# Patient Record
Sex: Male | Born: 1935 | Race: White | Hispanic: No | Smoking: Former smoker
Health system: Southern US, Community
[De-identification: ages and names within clinical notes are randomized; demographics above are authoritative.]

## PROBLEM LIST (undated history)

## (undated) DIAGNOSIS — S2249XA Multiple fractures of ribs, unspecified side, initial encounter for closed fracture: Secondary | ICD-10-CM

## (undated) DIAGNOSIS — F0390 Unspecified dementia without behavioral disturbance: Secondary | ICD-10-CM

## (undated) DIAGNOSIS — S2239XA Fracture of one rib, unspecified side, initial encounter for closed fracture: Secondary | ICD-10-CM

## (undated) DIAGNOSIS — S72009A Fracture of unspecified part of neck of unspecified femur, initial encounter for closed fracture: Secondary | ICD-10-CM

---

## 2004-07-22 ENCOUNTER — Inpatient Hospital Stay (HOSPITAL_COMMUNITY): Admission: EM | Admit: 2004-07-22 | Discharge: 2004-07-24 | Payer: Self-pay | Admitting: Emergency Medicine

## 2011-04-30 ENCOUNTER — Emergency Department (HOSPITAL_COMMUNITY)
Admission: EM | Admit: 2011-04-30 | Discharge: 2011-04-30 | Discharge status: Against Medical Advice | Payer: No Typology Code available for payment source | Attending: Emergency Medicine | Admitting: Emergency Medicine

## 2011-04-30 DIAGNOSIS — S40029A Contusion of unspecified upper arm, initial encounter: Secondary | ICD-10-CM | POA: Insufficient documentation

## 2011-04-30 DIAGNOSIS — IMO0002 Reserved for concepts with insufficient information to code with codable children: Secondary | ICD-10-CM | POA: Insufficient documentation

## 2011-04-30 DIAGNOSIS — S43499A Other sprain of unspecified shoulder joint, initial encounter: Secondary | ICD-10-CM | POA: Insufficient documentation

## 2011-04-30 DIAGNOSIS — R29898 Other symptoms and signs involving the musculoskeletal system: Secondary | ICD-10-CM | POA: Insufficient documentation

## 2011-04-30 DIAGNOSIS — M79609 Pain in unspecified limb: Secondary | ICD-10-CM | POA: Insufficient documentation

## 2013-07-27 ENCOUNTER — Inpatient Hospital Stay (HOSPITAL_COMMUNITY): Payer: No Typology Code available for payment source | Admitting: Anesthesiology

## 2013-07-27 ENCOUNTER — Encounter (HOSPITAL_COMMUNITY): Payer: Self-pay | Admitting: Emergency Medicine

## 2013-07-27 ENCOUNTER — Emergency Department (HOSPITAL_COMMUNITY): Payer: No Typology Code available for payment source

## 2013-07-27 ENCOUNTER — Encounter (HOSPITAL_COMMUNITY): Payer: No Typology Code available for payment source | Admitting: Anesthesiology

## 2013-07-27 ENCOUNTER — Encounter (HOSPITAL_COMMUNITY): Admission: EM | Disposition: A | Discharge status: Skilled Nursing Facility | Payer: Self-pay | Source: Home / Self Care

## 2013-07-27 ENCOUNTER — Inpatient Hospital Stay (HOSPITAL_COMMUNITY): Payer: No Typology Code available for payment source

## 2013-07-27 ENCOUNTER — Inpatient Hospital Stay (HOSPITAL_COMMUNITY)
Admission: EM | Admit: 2013-07-27 | Discharge: 2013-08-24 | DRG: 003 | Disposition: A | Discharge status: Skilled Nursing Facility | Payer: No Typology Code available for payment source | Attending: General Surgery | Admitting: General Surgery

## 2013-07-27 DIAGNOSIS — S36113A Laceration of liver, unspecified degree, initial encounter: Principal | ICD-10-CM | POA: Diagnosis present

## 2013-07-27 DIAGNOSIS — D62 Acute posthemorrhagic anemia: Secondary | ICD-10-CM

## 2013-07-27 DIAGNOSIS — S2232XA Fracture of one rib, left side, initial encounter for closed fracture: Secondary | ICD-10-CM

## 2013-07-27 DIAGNOSIS — S2242XA Multiple fractures of ribs, left side, initial encounter for closed fracture: Secondary | ICD-10-CM | POA: Diagnosis present

## 2013-07-27 DIAGNOSIS — S0285XB Fracture of orbit, unspecified, initial encounter for open fracture: Secondary | ICD-10-CM | POA: Diagnosis present

## 2013-07-27 DIAGNOSIS — S060XAA Concussion with loss of consciousness status unknown, initial encounter: Secondary | ICD-10-CM

## 2013-07-27 DIAGNOSIS — S32810A Multiple fractures of pelvis with stable disruption of pelvic ring, initial encounter for closed fracture: Secondary | ICD-10-CM

## 2013-07-27 DIAGNOSIS — S32509A Unspecified fracture of unspecified pubis, initial encounter for closed fracture: Secondary | ICD-10-CM

## 2013-07-27 DIAGNOSIS — S02109A Fracture of base of skull, unspecified side, initial encounter for closed fracture: Secondary | ICD-10-CM | POA: Diagnosis present

## 2013-07-27 DIAGNOSIS — J4489 Other specified chronic obstructive pulmonary disease: Secondary | ICD-10-CM | POA: Diagnosis present

## 2013-07-27 DIAGNOSIS — E876 Hypokalemia: Secondary | ICD-10-CM | POA: Diagnosis present

## 2013-07-27 DIAGNOSIS — F29 Unspecified psychosis not due to a substance or known physiological condition: Secondary | ICD-10-CM | POA: Diagnosis present

## 2013-07-27 DIAGNOSIS — S322XXA Fracture of coccyx, initial encounter for closed fracture: Secondary | ICD-10-CM

## 2013-07-27 DIAGNOSIS — E870 Hyperosmolality and hypernatremia: Secondary | ICD-10-CM | POA: Diagnosis present

## 2013-07-27 DIAGNOSIS — S0100XA Unspecified open wound of scalp, initial encounter: Secondary | ICD-10-CM | POA: Diagnosis present

## 2013-07-27 DIAGNOSIS — S01309A Unspecified open wound of unspecified ear, initial encounter: Secondary | ICD-10-CM | POA: Diagnosis present

## 2013-07-27 DIAGNOSIS — R339 Retention of urine, unspecified: Secondary | ICD-10-CM | POA: Diagnosis present

## 2013-07-27 DIAGNOSIS — S32309A Unspecified fracture of unspecified ilium, initial encounter for closed fracture: Secondary | ICD-10-CM | POA: Diagnosis present

## 2013-07-27 DIAGNOSIS — S139XXA Sprain of joints and ligaments of unspecified parts of neck, initial encounter: Secondary | ICD-10-CM | POA: Diagnosis present

## 2013-07-27 DIAGNOSIS — IMO0002 Reserved for concepts with insufficient information to code with codable children: Secondary | ICD-10-CM | POA: Diagnosis present

## 2013-07-27 DIAGNOSIS — J449 Chronic obstructive pulmonary disease, unspecified: Secondary | ICD-10-CM | POA: Diagnosis present

## 2013-07-27 DIAGNOSIS — S32591A Other specified fracture of right pubis, initial encounter for closed fracture: Secondary | ICD-10-CM | POA: Diagnosis present

## 2013-07-27 DIAGNOSIS — J984 Other disorders of lung: Secondary | ICD-10-CM | POA: Diagnosis present

## 2013-07-27 DIAGNOSIS — S51809A Unspecified open wound of unspecified forearm, initial encounter: Secondary | ICD-10-CM | POA: Diagnosis present

## 2013-07-27 DIAGNOSIS — S31109A Unspecified open wound of abdominal wall, unspecified quadrant without penetration into peritoneal cavity, initial encounter: Secondary | ICD-10-CM | POA: Diagnosis present

## 2013-07-27 DIAGNOSIS — S0190XA Unspecified open wound of unspecified part of head, initial encounter: Secondary | ICD-10-CM | POA: Diagnosis present

## 2013-07-27 DIAGNOSIS — S8000XA Contusion of unspecified knee, initial encounter: Secondary | ICD-10-CM | POA: Diagnosis present

## 2013-07-27 DIAGNOSIS — S0101XA Laceration without foreign body of scalp, initial encounter: Secondary | ICD-10-CM | POA: Diagnosis present

## 2013-07-27 DIAGNOSIS — S0180XA Unspecified open wound of other part of head, initial encounter: Secondary | ICD-10-CM | POA: Diagnosis present

## 2013-07-27 DIAGNOSIS — J15212 Pneumonia due to Methicillin resistant Staphylococcus aureus: Secondary | ICD-10-CM | POA: Diagnosis present

## 2013-07-27 DIAGNOSIS — S2249XA Multiple fractures of ribs, unspecified side, initial encounter for closed fracture: Secondary | ICD-10-CM | POA: Diagnosis present

## 2013-07-27 DIAGNOSIS — T148XXA Other injury of unspecified body region, initial encounter: Secondary | ICD-10-CM | POA: Diagnosis present

## 2013-07-27 DIAGNOSIS — S0280XA Fracture of other specified skull and facial bones, unspecified side, initial encounter for closed fracture: Secondary | ICD-10-CM | POA: Diagnosis present

## 2013-07-27 DIAGNOSIS — S72009A Fracture of unspecified part of neck of unspecified femur, initial encounter for closed fracture: Secondary | ICD-10-CM

## 2013-07-27 DIAGNOSIS — S060X9A Concussion with loss of consciousness of unspecified duration, initial encounter: Secondary | ICD-10-CM | POA: Diagnosis present

## 2013-07-27 DIAGNOSIS — S36115A Moderate laceration of liver, initial encounter: Secondary | ICD-10-CM | POA: Diagnosis present

## 2013-07-27 DIAGNOSIS — E2749 Other adrenocortical insufficiency: Secondary | ICD-10-CM | POA: Diagnosis present

## 2013-07-27 DIAGNOSIS — S51812A Laceration without foreign body of left forearm, initial encounter: Secondary | ICD-10-CM | POA: Diagnosis present

## 2013-07-27 DIAGNOSIS — J189 Pneumonia, unspecified organism: Secondary | ICD-10-CM

## 2013-07-27 DIAGNOSIS — S36899A Unspecified injury of other intra-abdominal organs, initial encounter: Secondary | ICD-10-CM | POA: Diagnosis present

## 2013-07-27 DIAGNOSIS — J40 Bronchitis, not specified as acute or chronic: Secondary | ICD-10-CM | POA: Diagnosis present

## 2013-07-27 DIAGNOSIS — S01312A Laceration without foreign body of left ear, initial encounter: Secondary | ICD-10-CM | POA: Diagnosis present

## 2013-07-27 DIAGNOSIS — D696 Thrombocytopenia, unspecified: Secondary | ICD-10-CM | POA: Diagnosis present

## 2013-07-27 DIAGNOSIS — S72142A Displaced intertrochanteric fracture of left femur, initial encounter for closed fracture: Secondary | ICD-10-CM

## 2013-07-27 DIAGNOSIS — J9600 Acute respiratory failure, unspecified whether with hypoxia or hypercapnia: Secondary | ICD-10-CM | POA: Diagnosis not present

## 2013-07-27 DIAGNOSIS — S2500XA Unspecified injury of thoracic aorta, initial encounter: Secondary | ICD-10-CM | POA: Diagnosis present

## 2013-07-27 DIAGNOSIS — S32592A Other specified fracture of left pubis, initial encounter for closed fracture: Secondary | ICD-10-CM

## 2013-07-27 DIAGNOSIS — S72143A Displaced intertrochanteric fracture of unspecified femur, initial encounter for closed fracture: Secondary | ICD-10-CM | POA: Diagnosis present

## 2013-07-27 DIAGNOSIS — R21 Rash and other nonspecific skin eruption: Secondary | ICD-10-CM | POA: Diagnosis not present

## 2013-07-27 DIAGNOSIS — S32602A Unspecified fracture of left ischium, initial encounter for closed fracture: Secondary | ICD-10-CM | POA: Diagnosis present

## 2013-07-27 DIAGNOSIS — R197 Diarrhea, unspecified: Secondary | ICD-10-CM | POA: Diagnosis not present

## 2013-07-27 DIAGNOSIS — R578 Other shock: Secondary | ICD-10-CM | POA: Diagnosis present

## 2013-07-27 DIAGNOSIS — S37812A Contusion of adrenal gland, initial encounter: Secondary | ICD-10-CM | POA: Diagnosis present

## 2013-07-27 DIAGNOSIS — S3210XA Unspecified fracture of sacrum, initial encounter for closed fracture: Secondary | ICD-10-CM | POA: Diagnosis present

## 2013-07-27 DIAGNOSIS — S32609A Unspecified fracture of unspecified ischium, initial encounter for closed fracture: Secondary | ICD-10-CM | POA: Diagnosis present

## 2013-07-27 HISTORY — PX: I & D EXTREMITY: SHX5045

## 2013-07-27 HISTORY — PX: LACERATION REPAIR: SHX5284

## 2013-07-27 HISTORY — DX: Fracture of one rib, unspecified side, initial encounter for closed fracture: S22.39XA

## 2013-07-27 HISTORY — PX: THORACIC AORTIC ENDOVASCULAR STENT GRAFT: SHX6112

## 2013-07-27 HISTORY — PX: FEMUR IM NAIL: SHX1597

## 2013-07-27 HISTORY — DX: Multiple fractures of ribs, unspecified side, initial encounter for closed fracture: S22.49XA

## 2013-07-27 LAB — POCT I-STAT 7, (LYTES, BLD GAS, ICA,H+H)
ACID-BASE DEFICIT: 5 mmol/L — AB (ref 0.0–2.0)
ACID-BASE DEFICIT: 6 mmol/L — AB (ref 0.0–2.0)
ACID-BASE DEFICIT: 5 mmol/L — AB (ref 0.0–2.0)
Acid-base deficit: 7 mmol/L — ABNORMAL HIGH (ref 0.0–2.0)
BICARBONATE: 20 meq/L (ref 20.0–24.0)
BICARBONATE: 20.1 meq/L (ref 20.0–24.0)
BICARBONATE: 20.4 meq/L (ref 20.0–24.0)
Bicarbonate: 20.7 meq/L (ref 20.0–24.0)
CALCIUM ION: 0.88 mmol/L — AB (ref 1.13–1.30)
CALCIUM ION: 0.91 mmol/L — AB (ref 1.13–1.30)
Calcium, Ion: 0.75 mmol/L — ABNORMAL LOW (ref 1.13–1.30)
Calcium, Ion: 0.65 mmol/L — ABNORMAL LOW (ref 1.13–1.30)
HCT: 19 % — ABNORMAL LOW (ref 39.0–52.0)
HCT: 26 % — ABNORMAL LOW (ref 39.0–52.0)
HEMATOCRIT: 25 % — AB (ref 39.0–52.0)
HEMATOCRIT: 27 % — AB (ref 39.0–52.0)
HEMOGLOBIN: 9.2 g/dL — AB (ref 13.0–17.0)
Hemoglobin: 6.5 g/dL — CL (ref 13.0–17.0)
Hemoglobin: 8.5 g/dL — ABNORMAL LOW (ref 13.0–17.0)
Hemoglobin: 8.8 g/dL — ABNORMAL LOW (ref 13.0–17.0)
O2 SAT: 100 %
O2 Saturation: 100 %
O2 Saturation: 100 %
O2 Saturation: 100 %
PCO2 ART: 36.6 mmHg (ref 35.0–45.0)
PH ART: 7.302 — AB (ref 7.350–7.450)
PH ART: 7.4 (ref 7.350–7.450)
PO2 ART: 478 mmHg — AB (ref 80.0–100.0)
PO2 ART: 479 mmHg — AB (ref 80.0–100.0)
POTASSIUM: 4.6 meq/L (ref 3.7–5.3)
POTASSIUM: 4.2 meq/L (ref 3.7–5.3)
Patient temperature: 36.5
Patient temperature: 34
Patient temperature: 34.5
Potassium: 4.4 mEq/L (ref 3.7–5.3)
Potassium: 4.4 meq/L (ref 3.7–5.3)
SODIUM: 141 meq/L (ref 137–147)
SODIUM: 142 meq/L (ref 137–147)
Sodium: 141 meq/L (ref 137–147)
Sodium: 141 meq/L (ref 137–147)
TCO2: 22 mmol/L (ref 0–100)
TCO2: 21 mmol/L (ref 0–100)
TCO2: 21 mmol/L (ref 0–100)
TCO2: 21 mmol/L (ref 0–100)
pCO2 arterial: 40.1 mmHg (ref 35.0–45.0)
pCO2 arterial: 39.3 mmHg (ref 35.0–45.0)
pCO2 arterial: 32 mmHg — ABNORMAL LOW (ref 35.0–45.0)
pH, Arterial: 7.318 — ABNORMAL LOW (ref 7.350–7.450)
pH, Arterial: 7.34 — ABNORMAL LOW (ref 7.350–7.450)
pO2, Arterial: 461 mmHg — ABNORMAL HIGH (ref 80.0–100.0)
pO2, Arterial: 460 mmHg — ABNORMAL HIGH (ref 80.0–100.0)

## 2013-07-27 LAB — BLOOD GAS, ARTERIAL
Acid-base deficit: 7.1 mmol/L — ABNORMAL HIGH (ref 0.0–2.0)
Bicarbonate: 16.5 mEq/L — ABNORMAL LOW (ref 20.0–24.0)
Drawn by: 36274
FIO2: 1 %
O2 Saturation: 100 %
PEEP: 5 cmH2O
Patient temperature: 93
RATE: 24 resp/min
TCO2: 17.3 mmol/L (ref 0–100)
VT: 500 mL
pCO2 arterial: 22.2 mmHg — ABNORMAL LOW (ref 35.0–45.0)
pH, Arterial: 7.467 — ABNORMAL HIGH (ref 7.350–7.450)
pO2, Arterial: 277 mmHg — ABNORMAL HIGH (ref 80.0–100.0)

## 2013-07-27 LAB — COMPREHENSIVE METABOLIC PANEL
ALT: 113 U/L — AB (ref 0–53)
AST: 152 U/L — AB (ref 0–37)
Albumin: 2.9 g/dL — ABNORMAL LOW (ref 3.5–5.2)
Alkaline Phosphatase: 45 U/L (ref 39–117)
BUN: 17 mg/dL (ref 6–23)
CALCIUM: 7.7 mg/dL — AB (ref 8.4–10.5)
CO2: 25 mEq/L (ref 19–32)
Chloride: 100 mEq/L (ref 96–112)
Creatinine, Ser: 1.05 mg/dL (ref 0.50–1.35)
GFR calc non Af Amer: 65 mL/min — ABNORMAL LOW (ref >90)
GFR, EST AFRICAN AMERICAN: 75 mL/min — AB (ref >90)
Glucose, Bld: 148 mg/dL — ABNORMAL HIGH (ref 70–99)
Potassium: 4.1 mEq/L (ref 3.7–5.3)
SODIUM: 139 meq/L (ref 137–147)
TOTAL PROTEIN: 6 g/dL (ref 6.0–8.3)
Total Bilirubin: <0.2 mg/dL — ABNORMAL LOW (ref 0.3–1.2)

## 2013-07-27 LAB — POCT I-STAT, CHEM 8
BUN: 17 mg/dL (ref 6–23)
CHLORIDE: 101 meq/L (ref 96–112)
Calcium, Ion: 1.1 mmol/L — ABNORMAL LOW (ref 1.13–1.30)
Creatinine, Ser: 1.2 mg/dL (ref 0.50–1.35)
GLUCOSE: 145 mg/dL — AB (ref 70–99)
HEMATOCRIT: 34 % — AB (ref 39.0–52.0)
Hemoglobin: 11.6 g/dL — ABNORMAL LOW (ref 13.0–17.0)
POTASSIUM: 3.9 meq/L (ref 3.7–5.3)
SODIUM: 140 meq/L (ref 137–147)
TCO2: 26 mmol/L (ref 0–100)

## 2013-07-27 LAB — URINALYSIS, ROUTINE W REFLEX MICROSCOPIC
BILIRUBIN URINE: NEGATIVE
Glucose, UA: NEGATIVE mg/dL
Ketones, ur: NEGATIVE mg/dL
Leukocytes, UA: NEGATIVE
NITRITE: NEGATIVE
PH: 6 (ref 5.0–8.0)
Protein, ur: NEGATIVE mg/dL
SPECIFIC GRAVITY, URINE: 1.037 — AB (ref 1.005–1.030)
Urobilinogen, UA: 0.2 mg/dL (ref 0.0–1.0)

## 2013-07-27 LAB — CBC
HCT: 29.1 % — ABNORMAL LOW (ref 39.0–52.0)
HEMATOCRIT: 33.7 % — AB (ref 39.0–52.0)
HEMOGLOBIN: 11.1 g/dL — AB (ref 13.0–17.0)
HEMOGLOBIN: 10 g/dL — AB (ref 13.0–17.0)
MCH: 32 pg (ref 26.0–34.0)
MCH: 29.4 pg (ref 26.0–34.0)
MCHC: 32.9 g/dL (ref 30.0–36.0)
MCHC: 34.4 g/dL (ref 30.0–36.0)
MCV: 97.1 fL (ref 78.0–100.0)
MCV: 85.6 fL (ref 78.0–100.0)
Platelets: 301 10*3/uL (ref 150–400)
Platelets: 77 10*3/uL — ABNORMAL LOW (ref 150–400)
RBC: 3.47 MIL/uL — ABNORMAL LOW (ref 4.22–5.81)
RBC: 3.4 MIL/uL — AB (ref 4.22–5.81)
RDW: 12.9 % (ref 11.5–15.5)
RDW: 14.3 % (ref 11.5–15.5)
WBC: 13.1 10*3/uL — ABNORMAL HIGH (ref 4.0–10.5)
WBC: 7.4 10*3/uL (ref 4.0–10.5)

## 2013-07-27 LAB — URINE MICROSCOPIC-ADD ON

## 2013-07-27 LAB — DIC (DISSEMINATED INTRAVASCULAR COAGULATION) PANEL
APTT: 50 s — AB (ref 24–37)
D-Dimer, Quant: 18.11 ug/mL-FEU — ABNORMAL HIGH (ref 0.00–0.48)
FIBRINOGEN: 107 mg/dL — AB (ref 204–475)
PLATELETS: 58 10*3/uL — AB (ref 150–400)
SMEAR REVIEW: NONE SEEN

## 2013-07-27 LAB — CG4 I-STAT (LACTIC ACID): LACTIC ACID, VENOUS: 3.41 mmol/L — AB (ref 0.5–2.2)

## 2013-07-27 LAB — PROTIME-INR
INR: 1.17 (ref 0.00–1.49)
INR: 1.43 (ref 0.00–1.49)
Prothrombin Time: 14.7 seconds (ref 11.6–15.2)
Prothrombin Time: 17.1 seconds — ABNORMAL HIGH (ref 11.6–15.2)

## 2013-07-27 LAB — DIC (DISSEMINATED INTRAVASCULAR COAGULATION)PANEL
INR: 1.96 — ABNORMAL HIGH (ref 0.00–1.49)
Prothrombin Time: 21.7 seconds — ABNORMAL HIGH (ref 11.6–15.2)

## 2013-07-27 LAB — ABO/RH: ABO/RH(D): B POS

## 2013-07-27 LAB — PREPARE RBC (CROSSMATCH)

## 2013-07-27 SURGERY — REPAIR, LACERATION, 2 OR MORE
Anesthesia: General | Site: Leg Upper

## 2013-07-27 MED ORDER — ROCURONIUM BROMIDE 100 MG/10ML IV SOLN
INTRAVENOUS | Status: DC | PRN
  Administered 2013-07-27: 50 mg via INTRAVENOUS
  Administered 2013-07-27: 30 mg via INTRAVENOUS
  Administered 2013-07-27: 20 mg via INTRAVENOUS

## 2013-07-27 MED ORDER — SODIUM CHLORIDE 0.9 % IR SOLN
Status: DC | PRN
  Administered 2013-07-27: 20:00:00

## 2013-07-27 MED ORDER — MORPHINE SULFATE 2 MG/ML IJ SOLN: 2.0000 mg | INTRAMUSCULAR | Status: DC | PRN

## 2013-07-27 MED ORDER — PANTOPRAZOLE SODIUM 40 MG PO TBEC: 40.0000 mg | DELAYED_RELEASE_TABLET | Freq: Every day | ORAL | Status: DC

## 2013-07-27 MED ORDER — METOPROLOL TARTRATE 1 MG/ML IV SOLN: 5.0000 mg | Freq: Four times a day (QID) | INTRAVENOUS | Status: DC | PRN

## 2013-07-27 MED ORDER — SODIUM CHLORIDE 0.9 % IV SOLN
1000.0000 mL | Freq: Once | INTRAVENOUS | Status: AC
  Administered 2013-07-27: 1000 mL via INTRAVENOUS

## 2013-07-27 MED ORDER — FENTANYL CITRATE 0.05 MG/ML IJ SOLN
50.0000 ug | Freq: Once | INTRAMUSCULAR | Status: AC
  Administered 2013-07-27: 50 ug via INTRAVENOUS

## 2013-07-27 MED ORDER — IOHEXOL 300 MG/ML  SOLN
100.0000 mL | Freq: Once | INTRAMUSCULAR | Status: AC | PRN
  Administered 2013-07-27: 100 mL via INTRAVENOUS

## 2013-07-27 MED ORDER — KCL IN DEXTROSE-NACL 20-5-0.9 MEQ/L-%-% IV SOLN
INTRAVENOUS | Status: DC
  Administered 2013-07-28 – 2013-08-03 (×8): via INTRAVENOUS
  Administered 2013-08-04: 50 mL/h via INTRAVENOUS
  Filled 2013-07-27 (×13): qty 1000

## 2013-07-27 MED ORDER — ETOMIDATE 2 MG/ML IV SOLN
INTRAVENOUS | Status: DC | PRN
  Administered 2013-07-27: 16 mg via INTRAVENOUS

## 2013-07-27 MED ORDER — SODIUM CHLORIDE 0.9 % IV SOLN
INTRAVENOUS | Status: DC | PRN
  Administered 2013-07-27: 18:00:00 via INTRAVENOUS

## 2013-07-27 MED ORDER — FENTANYL CITRATE 0.05 MG/ML IJ SOLN
50.0000 ug | Freq: Once | INTRAMUSCULAR | Status: AC
Start: 2013-07-27 — End: 2013-07-27
  Administered 2013-07-27: 50 ug via INTRAVENOUS

## 2013-07-27 MED ORDER — 0.9 % SODIUM CHLORIDE (POUR BTL) OPTIME
TOPICAL | Status: DC | PRN
  Administered 2013-07-27: 2000 mL

## 2013-07-27 MED ORDER — PROPOFOL INFUSION 10 MG/ML OPTIME
INTRAVENOUS | Status: DC | PRN
  Administered 2013-07-27: 50 ug/kg/min via INTRAVENOUS

## 2013-07-27 MED ORDER — PROPOFOL 10 MG/ML IV EMUL
5.0000 ug/kg/min | INTRAVENOUS | Status: DC
  Administered 2013-07-27: 70 ug/kg/min via INTRAVENOUS
  Administered 2013-07-28: 30 ug/kg/min via INTRAVENOUS
  Administered 2013-07-28: 50 ug/kg/min via INTRAVENOUS
  Administered 2013-07-28: 30 ug/kg/min via INTRAVENOUS
  Administered 2013-07-28: 70 ug/kg/min via INTRAVENOUS
  Administered 2013-07-29 – 2013-07-31 (×7): 30 ug/kg/min via INTRAVENOUS
  Administered 2013-08-01: 15 ug/kg/min via INTRAVENOUS
  Filled 2013-07-27 (×15): qty 100

## 2013-07-27 MED ORDER — FENTANYL CITRATE 0.05 MG/ML IJ SOLN
50.0000 ug | INTRAMUSCULAR | Status: DC | PRN
  Administered 2013-07-28: 10:00:00 via INTRAVENOUS
  Administered 2013-07-29: 50 ug via INTRAVENOUS
  Filled 2013-07-27 (×3): qty 2

## 2013-07-27 MED ORDER — ONDANSETRON HCL 4 MG PO TABS: 4.0000 mg | ORAL_TABLET | Freq: Four times a day (QID) | ORAL | Status: DC | PRN

## 2013-07-27 MED ORDER — PHENYLEPHRINE HCL 10 MG/ML IJ SOLN
10.0000 mg | INTRAVENOUS | Status: DC | PRN
  Administered 2013-07-27: 50 ug/min via INTRAVENOUS

## 2013-07-27 MED ORDER — CEFAZOLIN SODIUM-DEXTROSE 2-3 GM-% IV SOLR
INTRAVENOUS | Status: AC
  Filled 2013-07-27: qty 50

## 2013-07-27 MED ORDER — PROTAMINE SULFATE 10 MG/ML IV SOLN
INTRAVENOUS | Status: DC | PRN
  Administered 2013-07-27: 40 mg via INTRAVENOUS
  Administered 2013-07-27: 10 mg via INTRAVENOUS

## 2013-07-27 MED ORDER — HEPARIN SODIUM (PORCINE) 1000 UNIT/ML IJ SOLN
INTRAMUSCULAR | Status: DC | PRN
  Administered 2013-07-27: 4000 [IU] via INTRAVENOUS

## 2013-07-27 MED ORDER — ONDANSETRON HCL 4 MG/2ML IJ SOLN
4.0000 mg | Freq: Four times a day (QID) | INTRAMUSCULAR | Status: DC | PRN
Start: 2013-07-27 — End: 2013-08-24
  Administered 2013-08-24: 4 mg via INTRAVENOUS
  Filled 2013-07-27: qty 2

## 2013-07-27 MED ORDER — BACITRACIN-NEOMYCIN-POLYMYXIN 400-5-5000 EX OINT
TOPICAL_OINTMENT | CUTANEOUS | Status: AC
  Filled 2013-07-27: qty 1

## 2013-07-27 MED ORDER — FENTANYL CITRATE 0.05 MG/ML IJ SOLN
INTRAMUSCULAR | Status: DC | PRN
  Administered 2013-07-27 (×2): 100 ug via INTRAVENOUS
  Administered 2013-07-27: 150 ug via INTRAVENOUS

## 2013-07-27 MED ORDER — ONDANSETRON HCL 4 MG/2ML IJ SOLN
INTRAMUSCULAR | Status: AC
  Filled 2013-07-27: qty 2

## 2013-07-27 MED ORDER — LIDOCAINE HCL (CARDIAC) 20 MG/ML IV SOLN
INTRAVENOUS | Status: DC | PRN
  Administered 2013-07-27: 25 mg via INTRAVENOUS

## 2013-07-27 MED ORDER — LACTATED RINGERS IV SOLN
INTRAVENOUS | Status: DC | PRN
  Administered 2013-07-27 (×2): via INTRAVENOUS

## 2013-07-27 MED ORDER — SODIUM CHLORIDE 0.9 % IV SOLN
1000.0000 mL | INTRAVENOUS | Status: DC
  Administered 2013-07-27: 1000 mL via INTRAVENOUS

## 2013-07-27 MED ORDER — PANTOPRAZOLE SODIUM 40 MG IV SOLR
40.0000 mg | Freq: Every day | INTRAVENOUS | Status: DC
  Administered 2013-07-28 – 2013-08-04 (×8): 40 mg via INTRAVENOUS
  Filled 2013-07-27 (×9): qty 40

## 2013-07-27 MED ORDER — SUCCINYLCHOLINE CHLORIDE 20 MG/ML IJ SOLN
INTRAMUSCULAR | Status: DC | PRN
  Administered 2013-07-27: 100 mg via INTRAVENOUS

## 2013-07-27 MED ORDER — FENTANYL CITRATE 0.05 MG/ML IJ SOLN
INTRAMUSCULAR | Status: AC
  Filled 2013-07-27: qty 2

## 2013-07-27 MED ORDER — IODIXANOL 320 MG/ML IV SOLN
INTRAVENOUS | Status: DC | PRN
  Administered 2013-07-27: 22.1 mL via INTRA_ARTERIAL

## 2013-07-27 MED ORDER — CALCIUM CHLORIDE 10 % IV SOLN
INTRAVENOUS | Status: DC | PRN
  Administered 2013-07-27: 500 mg via INTRAVENOUS
  Administered 2013-07-27: 50 mg via INTRAVENOUS

## 2013-07-27 MED ORDER — TETANUS-DIPHTH-ACELL PERTUSSIS 5-2.5-18.5 LF-MCG/0.5 IM SUSP
0.5000 mL | Freq: Once | INTRAMUSCULAR | Status: AC
  Administered 2013-07-27: 0.5 mL via INTRAMUSCULAR
  Filled 2013-07-27: qty 0.5

## 2013-07-27 MED ORDER — DEXTROSE 5 % IV SOLN
2.0000 g | Freq: Once | INTRAVENOUS | Status: AC
  Administered 2013-07-27 (×2): 2 g via INTRAVENOUS
  Filled 2013-07-27: qty 20

## 2013-07-27 MED ORDER — LACTATED RINGERS IV SOLN
INTRAVENOUS | Status: DC | PRN
  Administered 2013-07-27: 18:00:00 via INTRAVENOUS

## 2013-07-27 MED ORDER — ONDANSETRON HCL 4 MG/2ML IJ SOLN
4.0000 mg | Freq: Once | INTRAMUSCULAR | Status: AC
  Administered 2013-07-27: 4 mg via INTRAVENOUS

## 2013-07-27 SURGICAL SUPPLY — 110 items
APL SKNCLS STERI-STRIP NONHPOA (GAUZE/BANDAGES/DRESSINGS) ×7
ATTRACTOMAT 16X20 MAGNETIC DRP (DRAPES) IMPLANT
BAG BANDED W/RUBBER/TAPE 36X54 (MISCELLANEOUS) ×9 IMPLANT
BAG DECANTER FOR FLEXI CONT (MISCELLANEOUS) ×9 IMPLANT
BAG EQP BAND 135X91 W/RBR TAPE (MISCELLANEOUS) ×7
BAG SNAP BAND KOVER 36X36 (MISCELLANEOUS) ×9 IMPLANT
BANDAGE ELASTIC 4 VELCRO ST LF (GAUZE/BANDAGES/DRESSINGS) ×9 IMPLANT
BANDAGE GAUZE ELAST BULKY 4 IN (GAUZE/BANDAGES/DRESSINGS) ×9 IMPLANT
BENZOIN TINCTURE PRP APPL 2/3 (GAUZE/BANDAGES/DRESSINGS) ×9 IMPLANT
BLADE SURG 15 STRL LF DISP TIS (BLADE) ×7 IMPLANT
BLADE SURG 15 STRL SS (BLADE) ×9
BNDG COHESIVE 4X5 TAN STRL (GAUZE/BANDAGES/DRESSINGS) ×9 IMPLANT
CANISTER SUCTION 2500CC (MISCELLANEOUS) ×18 IMPLANT
CATH ACCU-VU SIZ PIG 5F 100CM (CATHETERS) ×5 IMPLANT
CATH BEACON 5 .035 65 C1 TIP (CATHETERS) ×5 IMPLANT
CATH OMNI FLUSH .035X70CM (CATHETERS) ×5 IMPLANT
CATH SOS OMNI O 5F 80CM (CATHETERS) ×5 IMPLANT
CLEANER TIP ELECTROSURG 2X2 (MISCELLANEOUS) ×9 IMPLANT
CLIP LIGATING EXTRA MED SLVR (CLIP) ×5 IMPLANT
CLIP LIGATING EXTRA SM BLUE (MISCELLANEOUS) ×5 IMPLANT
CLOSURE WOUND 1/2 X4 (GAUZE/BANDAGES/DRESSINGS) ×1
CLOTH BEACON ORANGE TIMEOUT ST (SAFETY) ×18 IMPLANT
CONT SPEC 4OZ CLIKSEAL STRL BL (MISCELLANEOUS) ×9 IMPLANT
COVER DOME SNAP 22 D (MISCELLANEOUS) ×9 IMPLANT
COVER MAYO STAND STRL (DRAPES) ×9 IMPLANT
COVER PROBE W GEL 5X96 (DRAPES) ×9 IMPLANT
COVER SURGICAL LIGHT HANDLE (MISCELLANEOUS) ×27 IMPLANT
CRADLE DONUT ADULT HEAD (MISCELLANEOUS) IMPLANT
CUFF TOURNIQUET SINGLE 18IN (TOURNIQUET CUFF) ×9 IMPLANT
DRAIN CHANNEL 15F RND FF W/TCR (WOUND CARE) IMPLANT
DRAPE INCISE 13X13 STRL (DRAPES) IMPLANT
DRAPE TABLE COVER HEAVY DUTY (DRAPES) ×9 IMPLANT
DRESSING OPSITE X SMALL 2X3 (GAUZE/BANDAGES/DRESSINGS) ×36 IMPLANT
DRSG EMULSION OIL 3X3 NADH (GAUZE/BANDAGES/DRESSINGS) ×9 IMPLANT
DRSG PAD ABDOMINAL 8X10 ST (GAUZE/BANDAGES/DRESSINGS) ×18 IMPLANT
DRSG TEGADERM 2-3/8X2-3/4 SM (GAUZE/BANDAGES/DRESSINGS) ×5 IMPLANT
ELECT CAUTERY BLADE 6.4 (BLADE) ×5 IMPLANT
ELECT COATED BLADE 2.86 ST (ELECTRODE) ×9 IMPLANT
ELECT REM PT RETURN 9FT ADLT (ELECTROSURGICAL) ×27
ELECTRODE REM PT RTRN 9FT ADLT (ELECTROSURGICAL) ×21 IMPLANT
ENDOPROSTHESIS THORAC 34X34X10 (Endovascular Graft) ×3 IMPLANT
ENDOPROTH THORACIC 34X34X10 (Endovascular Graft) ×9 IMPLANT
EVACUATOR SILICONE 100CC (DRAIN) IMPLANT
GAUZE SPONGE 2X2 8PLY STRL LF (GAUZE/BANDAGES/DRESSINGS) ×7 IMPLANT
GAUZE SPONGE 4X4 16PLY XRAY LF (GAUZE/BANDAGES/DRESSINGS) ×9 IMPLANT
GAUZE XEROFORM 5X9 LF (GAUZE/BANDAGES/DRESSINGS) ×24 IMPLANT
GLOVE BIOGEL PI IND STRL 7.5 (GLOVE) ×3 IMPLANT
GLOVE BIOGEL PI IND STRL 8 (GLOVE) ×7 IMPLANT
GLOVE BIOGEL PI INDICATOR 7.5 (GLOVE) ×2
GLOVE BIOGEL PI INDICATOR 8 (GLOVE) ×2
GLOVE ECLIPSE 7.5 STRL STRAW (GLOVE) ×9 IMPLANT
GLOVE ORTHO TXT STRL SZ7.5 (GLOVE) ×9 IMPLANT
GLOVE SS BIOGEL STRL SZ 7.5 (GLOVE) ×7 IMPLANT
GLOVE SUPERSENSE BIOGEL SZ 7.5 (GLOVE) ×2
GOWN PREVENTION PLUS LG XLONG (DISPOSABLE) IMPLANT
GOWN PREVENTION PLUS XLARGE (GOWN DISPOSABLE) ×9 IMPLANT
GOWN STRL NON-REIN LRG LVL3 (GOWN DISPOSABLE) ×36 IMPLANT
GOWN STRL REUS W/ TWL LRG LVL3 (GOWN DISPOSABLE) ×21 IMPLANT
GOWN STRL REUS W/TWL LRG LVL3 (GOWN DISPOSABLE) ×27
GRAFT BALLN CATH 65CM (STENTS) ×3 IMPLANT
KIT BASIN OR (CUSTOM PROCEDURE TRAY) ×27 IMPLANT
KIT ROOM TURNOVER OR (KITS) ×27 IMPLANT
MANIFOLD NEPTUNE II (INSTRUMENTS) ×9 IMPLANT
NDL PERC 18GX7CM (NEEDLE) ×4 IMPLANT
NEEDLE PERC 18GX7CM (NEEDLE) ×9 IMPLANT
NS IRRIG 1000ML POUR BTL (IV SOLUTION) ×27 IMPLANT
PACK AORTA (CUSTOM PROCEDURE TRAY) ×9 IMPLANT
PACK ORTHO EXTREMITY (CUSTOM PROCEDURE TRAY) ×9 IMPLANT
PAD ARMBOARD 7.5X6 YLW CONV (MISCELLANEOUS) ×54 IMPLANT
PENCIL BUTTON HOLSTER BLD 10FT (ELECTRODE) IMPLANT
PENCIL FOOT CONTROL (ELECTRODE) ×9 IMPLANT
PROTECTION STATION PRESSURIZED (MISCELLANEOUS) ×9
SHEATH AVANTI 11CM 8FR (MISCELLANEOUS) ×5 IMPLANT
SHEATH BRITE TIP 8FR 23CM (MISCELLANEOUS) ×5 IMPLANT
SHEATH DRYSEAL 22FRX28CM (MISCELLANEOUS) ×5 IMPLANT
SPONGE GAUZE 2X2 STER 10/PKG (GAUZE/BANDAGES/DRESSINGS) ×2
SPONGE GAUZE 4X4 12PLY (GAUZE/BANDAGES/DRESSINGS) ×9 IMPLANT
SPONGE INTESTINAL PEANUT (DISPOSABLE) IMPLANT
SPONGE LAP 18X18 X RAY DECT (DISPOSABLE) ×9 IMPLANT
SPONGE LAP 4X18 X RAY DECT (DISPOSABLE) ×9 IMPLANT
STAPLER VISISTAT 35W (STAPLE) IMPLANT
STATION PROTECTION PRESSURIZED (MISCELLANEOUS) ×7 IMPLANT
STENT GRAFT BALLN CATH 65CM (STENTS) ×2
STOCKINETTE IMPERVIOUS 9X36 MD (GAUZE/BANDAGES/DRESSINGS) ×9 IMPLANT
STOPCOCK MORSE 400PSI 3WAY (MISCELLANEOUS) ×9 IMPLANT
STRIP CLOSURE SKIN 1/2X4 (GAUZE/BANDAGES/DRESSINGS) ×8 IMPLANT
SUT CHROMIC 4 0 P 3 18 (SUTURE) ×5 IMPLANT
SUT ETHILON 4 0 PS 2 18 (SUTURE) ×41 IMPLANT
SUT ETHILON 5 0 P 3 18 (SUTURE) ×2
SUT NYLON ETHILON 5-0 P-3 1X18 (SUTURE) ×3 IMPLANT
SUT VIC AB 3-0 SH 18 (SUTURE) IMPLANT
SUT VICRYL 4-0 PS2 18IN ABS (SUTURE) ×18 IMPLANT
SYR 20CC LL (SYRINGE) ×18 IMPLANT
SYR 30ML LL (SYRINGE) ×5 IMPLANT
SYR 5ML LL (SYRINGE) ×4 IMPLANT
SYR MEDRAD MARK V 150ML (SYRINGE) ×9 IMPLANT
SYRINGE 10CC LL (SYRINGE) ×32 IMPLANT
TOWEL OR 17X24 6PK STRL BLUE (TOWEL DISPOSABLE) ×36 IMPLANT
TOWEL OR 17X26 10 PK STRL BLUE (TOWEL DISPOSABLE) ×36 IMPLANT
TRAY ENT MC OR (CUSTOM PROCEDURE TRAY) ×9 IMPLANT
TRAY FOLEY CATH 16FRSI W/METER (SET/KITS/TRAYS/PACK) ×9 IMPLANT
TUBE ANAEROBIC SPECIMEN COL (MISCELLANEOUS) IMPLANT
TUBE CONNECTING 12'X1/4 (SUCTIONS) ×1
TUBE CONNECTING 12X1/4 (SUCTIONS) ×8 IMPLANT
TUBING HIGH PRESSURE 120CM (CONNECTOR) ×9 IMPLANT
UNDERPAD 30X30 INCONTINENT (UNDERPADS AND DIAPERS) ×9 IMPLANT
WIRE AMPLATZ SS-J .035X180CM (WIRE) ×10 IMPLANT
WIRE BENTSON .035X145CM (WIRE) ×10 IMPLANT
WIRE STIFF LUNDERQUIST 260CM (WIRE) ×5 IMPLANT
YANKAUER SUCT BULB TIP NO VENT (SUCTIONS) ×9 IMPLANT

## 2013-07-27 NOTE — ED Notes (Signed)
Lab results reported to EDP. 

## 2013-07-27 NOTE — H&P (Signed)
Critical care 65 minutes  Also has significant abrasions and lacs L forearm - including elbow area likely into joint. Possible L knee injury as well. Dr. Ophelia CharterYates present at bedside.  Dr. Arbie CookeyEarly also present at bedside. BP dropped to high 70's. Transfused 2u PRBC. Will not push BP too high with aortic injury.   Patient examined and I agree with the assessment and plan  Violeta GelinasBurke Dayle Mcnerney, MD, MPH, FACS Pager: [phone]  07/27/2013 5:13 PM

## 2013-07-27 NOTE — ED Notes (Signed)
Pt arrival to CT with this RN
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
## 2013-07-27 NOTE — Anesthesia Preprocedure Evaluation (Addendum)
Anesthesia Evaluation  Patient identified by MRN, date of birth, ID band Patient awake    Reviewed: Allergy & Precautions, H&P , NPO status , Patient's Chart, lab work & pertinent test results  History of Anesthesia Complications Negative for: history of anesthetic complications  Airway Mallampati: II TM Distance: >3 FB Neck ROM: Limited    Dental  (+) Upper Dentures, Poor Dentition, Loose, Missing and Dental Advisory Given   Pulmonary COPDCurrent Smoker,  breath sounds clear to auscultation        Cardiovascular Rhythm:Regular Rate:Tachycardia  Acute thoracic aorta tear:  Hypotensive, receiving PRBCs   Neuro/Psych confused    GI/Hepatic negative GI ROS, Liver lac: elevated LFTs   Endo/Other  negative endocrine ROS  Renal/GU negative Renal ROS     Musculoskeletal   Abdominal   Peds  Hematology Received 2u PRBC and 1u FFP in ED   Anesthesia Other Findings   Reproductive/Obstetrics                          Anesthesia Physical Anesthesia Plan  ASA: IV and emergent  Anesthesia Plan: General   Post-op Pain Management:    Induction: Intravenous  Airway Management Planned: Oral ETT  Additional Equipment: Arterial line, CVP and Ultrasound Guidance Line Placement  Intra-op Plan:   Post-operative Plan: Post-operative intubation/ventilation  Informed Consent:   Only emergency history available  Plan Discussed with: CRNA and Surgeon  Anesthesia Plan Comments: (Plan routine monitors, A line, central access, GETA with VideoGlide intubation, transfusion, and post op ventilation)       Anesthesia Quick Evaluation

## 2013-07-27 NOTE — Brief Op Note (Signed)
07/27/2013  9:24 PM  PATIENT:  [name] [name]  80 y.o. male  PRE-OPERATIVE DIAGNOSIS:  Aorta  POST-OPERATIVE DIAGNOSIS:  Tear Descending Aorta,   Open left elbow laceration, multiple left forearm laceration. Left proximal femur fracture  PROCEDURE:  Procedure(s): THORACIC AORTIC ENDOVASCULAR STENT GRAFT (N/A) IRRIGATION AND DEBRIDEMENT ELBOW (Left) Closure of Right Eye Lacerationl; Closure of Left Post-Auricular Laceration Femoral Pin Traction (Left)  Irrigation and excisional debridement of left open elbow laceration, debridement and closure of multiple forearm lacerations.    SURGEON:  Surgeon(s) and Role: Panel 1:    * Suzanna ObeyJohn Byers, MD - Primary  Panel 2:    * Larina Earthlyodd F Early, MD - Primary  Panel 3:    * Eldred MangesMark C Daziya Redmond, MD - Primary  PHYSICIAN ASSISTANT:   ASSISTANTS: none   ANESTHESIA:   general  EBL:  Total I/O In: 3618 [I.V.:500; Blood:3118] Out: 500 [Urine:500]  BLOOD ADMINISTERED:10 units CC PRBC  DRAINS: none   LOCAL MEDICATIONS USED:  NONE  SPECIMEN:  No Specimen  DISPOSITION OF SPECIMEN:  N/A  COUNTS:  YES  TOURNIQUET:  * No tourniquets in log *  DICTATION: .Other Dictation: Dictation Number [id number]  PLAN OF CARE: to ICU  PATIENT DISPOSITION:  ICU - intubated and critically ill.   Delay start of Pharmacological VTE agent (>24hrs) due to surgical blood loss or risk of bleeding: yes.  INR 2.  NO LOVENOX at this time.
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
## 2013-07-27 NOTE — Progress Notes (Signed)
Orthopedic Tech Progress Note Patient Details:  [name] [name] 12/12/1930 [id number]  Musculoskeletal Traction Type of Traction: Skeletal (Balanced Suspension) Traction Location: lle Traction Weight: 25 lbs  20lbs to femur; 5lbs to foot as ordered by Dr. Lyman SpellerYates  [name] [name] 07/27/2013, 11:08 PM
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
## 2013-07-27 NOTE — Anesthesia Procedure Notes (Addendum)
Anesthesia Procedure Note CVP: Timeout, sterile prep, drape, FBP R neck.  Trendelenburg position.  1% lido local trocar RIJ 1st pass with US guidance.  Cordis introducer placed over J wire. Biopatch and sterile dressing on.  Patient tolerated well.  VSS.  Jenita Seashore, MD  18:25-18:36 Procedure Name: Intubation Date/Time: 07/27/2013 6:23 PM Performed by: Trixie Deis A Pre-anesthesia Checklist: Patient identified, Timeout performed, Emergency Drugs available, Suction available and Patient being monitored Patient Re-evaluated:Patient Re-evaluated prior to inductionOxygen Delivery Method: Circle system utilized Preoxygenation: Pre-oxygenation with 100% oxygen Intubation Type: IV induction and Rapid sequence Grade View: Grade I Tube type: Subglottic suction tube Tube size: 7.5 mm Number of attempts: 1 Airway Equipment and Method: Rigid stylet and Video-laryngoscopy Placement Confirmation: ETT inserted through vocal cords under direct vision,  breath sounds checked- equal and bilateral and positive ETCO2 Secured at: 23 cm Tube secured with: Tape Dental Injury: Teeth and Oropharynx as per pre-operative assessment    Anesthesia Procedure Note CVP: Timeout, sterile prep, drape, FBP R neck.  Trendelenburg position.  1% lido local trocar RIJ 1st pass with US guidance.  Cordis introducer placed over J wire. Biopatch and sterile dressing on.  Patient tolerated well.  VSS.  Jenita Seashore, MD  [phone]

## 2013-07-27 NOTE — Preoperative (Signed)
Beta Blockers   Reason not to administer Beta Blockers:Not Applicable 

## 2013-07-27 NOTE — Progress Notes (Addendum)
Orthopedics  Due to  Patient 10 units PRBC's with INR 2.0 platelets dropped to 58K we will delay fixation of femur . Femoral traction pin applied for skeletal traction.  Elbow open injury washed out and closed. Will need ICU stabilization and delay for a few days femur rod placement if patient improves.  With INR of 2.0 his femur fracture surgery  at this time risks worsening DIC progression.  Will follow.   Ophelia CharterYates [phone]

## 2013-07-27 NOTE — ED Notes (Signed)
[employer] in to speak with pt
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
## 2013-07-27 NOTE — ED Notes (Signed)
Pt vomited all over himself.

## 2013-07-27 NOTE — OR Nursing (Signed)
Dr. Jearld FentonByers Procedure 1- Closure of Right Eye Laceration and Left Post-Auricular Laceration ended at 1911.

## 2013-07-27 NOTE — Transfer of Care (Signed)
Immediate Anesthesia Transfer of Care Note  Patient: [name] [name]  Procedure(s) Performed: Procedure(s): THORACIC AORTIC ENDOVASCULAR STENT GRAFT (N/A) IRRIGATION AND DEBRIDEMENT ELBOW (Left) Closure of Right Eye Lacerationl; Closure of Left Post-Auricular Laceration Femoral Pin Traction (Left)  Patient Location: NICU  Anesthesia Type:General  Level of Consciousness: Patient remains intubated per anesthesia plan  Airway & Oxygen Therapy: Patient remains intubated per anesthesia plan and Patient placed on Ventilator (see vital sign flow sheet for setting)  Post-op Assessment: Report given to PACU RN and Post -op Vital signs reviewed and stable  Post vital signs: Reviewed and stable  Complications: No apparent anesthesia complications
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
## 2013-07-27 NOTE — ED Notes (Signed)
Pharmacy contacted about antibiotic
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
## 2013-07-27 NOTE — Progress Notes (Signed)
Orthopedic Tech Progress Note Patient Details:  Dorena DewJerry Crevier 01/20/1931 [id number] Hare traction applied per verbal orders in Trauma Rm B to stabilize suspected hip dislocation and femur fracture. Patient to be transported to CT and xray and further evaluated. Ortho Tech on stand bye for further instructions.  Musculoskeletal Traction Type of Traction: Hare Traction Traction Location: LLE    Asia R Janee Mornhompson 07/27/2013, 3:29 PM
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
## 2013-07-28 DIAGNOSIS — J95821 Acute postprocedural respiratory failure: Secondary | ICD-10-CM

## 2013-07-28 LAB — PREPARE FRESH FROZEN PLASMA
UNIT DIVISION: 0
UNIT DIVISION: 0
UNIT DIVISION: 0
UNIT DIVISION: 0
Unit division: 0
Unit division: 0

## 2013-07-28 LAB — CBC
HCT: 27.1 % — ABNORMAL LOW (ref 39.0–52.0)
HCT: 25.4 % — ABNORMAL LOW (ref 39.0–52.0)
HEMATOCRIT: 23.5 % — AB (ref 39.0–52.0)
HEMATOCRIT: 27.8 % — AB (ref 39.0–52.0)
HEMOGLOBIN: 10.2 g/dL — AB (ref 13.0–17.0)
HEMOGLOBIN: 9.2 g/dL — AB (ref 13.0–17.0)
Hemoglobin: 8.4 g/dL — ABNORMAL LOW (ref 13.0–17.0)
Hemoglobin: 9.8 g/dL — ABNORMAL LOW (ref 13.0–17.0)
MCH: 29.4 pg (ref 26.0–34.0)
MCH: 29.6 pg (ref 26.0–34.0)
MCH: 29.7 pg (ref 26.0–34.0)
MCH: 29.5 pg (ref 26.0–34.0)
MCHC: 35.7 g/dL (ref 30.0–36.0)
MCHC: 36.2 g/dL — ABNORMAL HIGH (ref 30.0–36.0)
MCHC: 36.7 g/dL — ABNORMAL HIGH (ref 30.0–36.0)
MCHC: 36.2 g/dL — ABNORMAL HIGH (ref 30.0–36.0)
MCV: 82.2 fL (ref 78.0–100.0)
MCV: 81.9 fL (ref 78.0–100.0)
MCV: 80.8 fL (ref 78.0–100.0)
MCV: 81.4 fL (ref 78.0–100.0)
PLATELETS: 130 10*3/uL — AB (ref 150–400)
Platelets: 144 10*3/uL — ABNORMAL LOW (ref 150–400)
Platelets: 114 10*3/uL — ABNORMAL LOW (ref 150–400)
Platelets: 97 10*3/uL — ABNORMAL LOW (ref 150–400)
RBC: 2.86 MIL/uL — ABNORMAL LOW (ref 4.22–5.81)
RBC: 3.31 MIL/uL — AB (ref 4.22–5.81)
RBC: 3.44 MIL/uL — ABNORMAL LOW (ref 4.22–5.81)
RBC: 3.12 MIL/uL — ABNORMAL LOW (ref 4.22–5.81)
RDW: 14.6 % (ref 11.5–15.5)
RDW: 14.6 % (ref 11.5–15.5)
RDW: 15 % (ref 11.5–15.5)
RDW: 15.5 % (ref 11.5–15.5)
WBC: 5.8 10*3/uL (ref 4.0–10.5)
WBC: 6.7 10*3/uL (ref 4.0–10.5)
WBC: 9.8 10*3/uL (ref 4.0–10.5)
WBC: 9.6 10*3/uL (ref 4.0–10.5)

## 2013-07-28 LAB — POCT I-STAT 3, ART BLOOD GAS (G3+)
ACID-BASE DEFICIT: 3 mmol/L — AB (ref 0.0–2.0)
ACID-BASE DEFICIT: 3 mmol/L — AB (ref 0.0–2.0)
BICARBONATE: 20 meq/L (ref 20.0–24.0)
Bicarbonate: 20.7 meq/L (ref 20.0–24.0)
O2 Saturation: 99 %
O2 Saturation: 99 %
TCO2: 21 mmol/L (ref 0–100)
TCO2: 22 mmol/L (ref 0–100)
pCO2 arterial: 27.3 mmHg — ABNORMAL LOW (ref 35.0–45.0)
pCO2 arterial: 30.9 mmHg — ABNORMAL LOW (ref 35.0–45.0)
pH, Arterial: 7.476 — ABNORMAL HIGH (ref 7.350–7.450)
pH, Arterial: 7.436 (ref 7.350–7.450)
pO2, Arterial: 139 mmHg — ABNORMAL HIGH (ref 80.0–100.0)
pO2, Arterial: 128 mmHg — ABNORMAL HIGH (ref 80.0–100.0)

## 2013-07-28 LAB — PREPARE RBC (CROSSMATCH)

## 2013-07-28 LAB — BASIC METABOLIC PANEL
BUN: 17 mg/dL (ref 6–23)
CO2: 19 mEq/L (ref 19–32)
Calcium: 6.7 mg/dL — ABNORMAL LOW (ref 8.4–10.5)
Chloride: 109 mEq/L (ref 96–112)
Creatinine, Ser: 0.84 mg/dL (ref 0.50–1.35)
GFR calc Af Amer: >90 mL/min (ref >90)
GFR calc non Af Amer: 81 mL/min — ABNORMAL LOW (ref >90)
Glucose, Bld: 172 mg/dL — ABNORMAL HIGH (ref 70–99)
Potassium: 4.4 mEq/L (ref 3.7–5.3)
Sodium: 142 mEq/L (ref 137–147)

## 2013-07-28 LAB — MRSA PCR SCREENING: MRSA by PCR: NEGATIVE

## 2013-07-28 LAB — LACTIC ACID, PLASMA
LACTIC ACID, VENOUS: 6.7 mmol/L — AB (ref 0.5–2.2)
LACTIC ACID, VENOUS: 4.5 mmol/L — AB (ref 0.5–2.2)

## 2013-07-28 LAB — PREPARE PLATELET PHERESIS: Unit division: 0

## 2013-07-28 LAB — PROTIME-INR
INR: 1.3 (ref 0.00–1.49)
PROTHROMBIN TIME: 15.9 s — AB (ref 11.6–15.2)

## 2013-07-28 MED ORDER — CHLORHEXIDINE GLUCONATE 0.12 % MT SOLN
15.0000 mL | Freq: Two times a day (BID) | OROMUCOSAL | Status: DC
  Administered 2013-07-28 – 2013-08-22 (×52): 15 mL via OROMUCOSAL
  Filled 2013-07-28 (×52): qty 15

## 2013-07-28 MED ORDER — FENTANYL CITRATE 0.05 MG/ML IJ SOLN
50.0000 ug | INTRAMUSCULAR | Status: DC | PRN
  Administered 2013-07-29 – 2013-08-03 (×5): 50 ug via INTRAVENOUS
  Filled 2013-07-28 (×3): qty 2

## 2013-07-28 MED ORDER — BIOTENE DRY MOUTH MT LIQD
15.0000 mL | Freq: Two times a day (BID) | OROMUCOSAL | Status: DC
  Administered 2013-07-28 – 2013-08-22 (×49): 15 mL via OROMUCOSAL

## 2013-07-28 MED ORDER — SODIUM CHLORIDE 0.9 % IJ SOLN
10.0000 mL | INTRAMUSCULAR | Status: DC | PRN
  Administered 2013-07-30 – 2013-08-23 (×8): 10 mL

## 2013-07-28 MED ORDER — ALBUMIN HUMAN 5 % IV SOLN
12.5000 g | Freq: Once | INTRAVENOUS | Status: AC
  Administered 2013-07-28: 12.5 g via INTRAVENOUS
  Filled 2013-07-28: qty 250

## 2013-07-28 MED ORDER — CEFAZOLIN SODIUM-DEXTROSE 2-3 GM-% IV SOLR
2.0000 g | Freq: Once | INTRAVENOUS | Status: AC
  Administered 2013-07-28: 2 g via INTRAVENOUS
  Filled 2013-07-28: qty 50

## 2013-07-28 MED ORDER — SODIUM CHLORIDE 0.9 % IJ SOLN
10.0000 mL | Freq: Two times a day (BID) | INTRAMUSCULAR | Status: DC
  Administered 2013-07-28 – 2013-08-02 (×10): 10 mL
  Administered 2013-08-02: 20 mL
  Administered 2013-08-03 (×2): 10 mL
  Administered 2013-08-04: 20 mL
  Administered 2013-08-04: 30 mL
  Administered 2013-08-05: 10 mL
  Administered 2013-08-05 – 2013-08-06 (×2): 20 mL
  Administered 2013-08-06 – 2013-08-09 (×4): 10 mL
  Administered 2013-08-09: 20 mL
  Administered 2013-08-10 – 2013-08-13 (×7): 10 mL
  Administered 2013-08-14: 30 mL
  Administered 2013-08-14: 10 mL
  Administered 2013-08-15: 20 mL
  Administered 2013-08-15 – 2013-08-18 (×7): 10 mL
  Administered 2013-08-19: 20 mL
  Administered 2013-08-19 – 2013-08-23 (×5): 10 mL

## 2013-07-28 NOTE — Progress Notes (Signed)
Patient ID: [name] [name], male   DOB: 07/20/1930, 80 y.o.   MRN: [id number] INR better 1.4 improved, only one unit blood since OR.  Platelets improved.  Will post tomorrow for 12:30 PM surgery for left trochanteric nail fixation of his unstable femur fracture.  Hopefully his status will be stable and coninue to improve.  

## 2013-07-28 NOTE — Progress Notes (Signed)
UR completed.  Durante Violett, RN BSN [facility] CCM Trauma/Neuro ICU Case Manager [phone]  

## 2013-07-28 NOTE — Progress Notes (Signed)
Orthopedic Tech Progress Note Patient Details:  [name] [name] 08/17/1930 [id number] Spoke with Dr. Ophelia CharterYates this morning and he requested that a lamb's wool dressing be applied to the traction apparatus to protect to the patient's skin and to relieve pressure off of the bars making contact with patient. Dr. Ophelia CharterYates also requested that further padding be added by way of foam egg crate padding. Lamb's wool padding was added as requested. Spoke with patient's nurse and she ordered egg crate pads to be sent to floor. Explained to nurse where padding was to be adding and why and she stated that she could add it when it came up and if she needed assistance she would give Ortho Tech a call back. I will check on patient and traction application later in the day. Patient ID: [name] [name], male   DOB: 12/12/1930, 80 y.o.   MRN: [id number]   [name] [name] 07/28/2013, 11:04 AM
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
## 2013-07-28 NOTE — Progress Notes (Signed)
Peripherally Inserted Central Catheter/Midline Placement  The IV Nurse has discussed with the patient and/or persons authorized to consent for the patient, the purpose of this procedure and the potential benefits and risks involved with this procedure.  The benefits include less needle sticks, lab draws from the catheter and patient may be discharged home with the catheter.  Risks include, but not limited to, infection, bleeding, blood clot (thrombus formation), and puncture of an artery; nerve damage and irregular heat beat.  Alternatives to this procedure were also discussed.  PICC/Midline Placement Documentation        Lisabeth DevoidGibbs, Pierina Schuknecht Jeanette 07/28/2013, 10:03 AM Medical necessity permission signed by Dr. Violeta GelinasBurke Thompson

## 2013-07-28 NOTE — Progress Notes (Signed)
Dr. Dwain SarnaWakefield notified of L ear bleeding. Surgicel ordered and placed at bedside.

## 2013-07-28 NOTE — Progress Notes (Addendum)
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 inferior endplate spurring in the cervical spine are stable. Visualized skull base is intact. No atlanto-occipital dissociation. No acute cervical spine fracture identified. Grossly intact visualized upper thoracic levels.  Abnormal hemorrhage in the left carotid space at the level of the thyroid, and tracking along the proximal left subclavian artery. See chest CT findings from today reported separately.  IMPRESSION: 1. No acute traumatic injury to the brain. Scalp lacerations and hematomas. 2. Small comminuted fracture at the anterior superior right orbital rim with comminution fragments situated adjacent to the right globe which remains intact. No intraorbital hematoma identified. 3. No other acute facial fracture. No acute fracture or listhesis identified in the cervical spine. Ligamentous injury is not excluded. 4. Soft tissue injury suspected adjacent to the left mastoid eminence, with gas tracking in the left sternocleidomastoid muscle. 5. Abnormal thoracic inlet, see chest CT findings reported separately. Study reviewed in person with Violeta GelinasBURKE THOMPSON on 07/27/2013 at 1515 hours .   Electronically Signed   By: Augusto GambleLee  Hall M.D.   On: 07/27/2013 16:35   Anti-infectives: Anti-infectives   Start     Dose/Rate Route Frequency Ordered Stop   07/27/13 1617  ceFAZolin (ANCEF) 2 g in dextrose  5 % 50 mL IVPB     2 g 140 mL/hr over 30 Minutes Intravenous  Once 07/27/13 1618 07/27/13 1856      Assessment/Plan: s/p Procedure(s): THORACIC AORTIC ENDOVASCULAR STENT GRAFT (N/A) IRRIGATION AND DEBRIDEMENT ELBOW (Left) Closure of Right Eye Lacerationl; Closure of Left Post-Auricular Laceration Femoral Pin Traction (Left) Critically ill. No evidence of intrathoracic bleeding. Continue support resuscitation per trauma service   LOS: 1 day   Elcie Pelster 07/28/2013, 8:00 AM
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
## 2013-07-28 NOTE — Progress Notes (Signed)
Pts. 7.5 E.T. tube retaped @ 23cm with pink tape.

## 2013-07-29 ENCOUNTER — Inpatient Hospital Stay (HOSPITAL_COMMUNITY): Payer: No Typology Code available for payment source | Admitting: Certified Registered Nurse Anesthetist

## 2013-07-29 ENCOUNTER — Inpatient Hospital Stay (HOSPITAL_COMMUNITY): Payer: No Typology Code available for payment source

## 2013-07-29 ENCOUNTER — Encounter (HOSPITAL_COMMUNITY): Admission: EM | Disposition: A | Discharge status: Skilled Nursing Facility | Payer: Self-pay | Source: Home / Self Care

## 2013-07-29 ENCOUNTER — Encounter (HOSPITAL_COMMUNITY): Payer: Self-pay | Admitting: Anesthesiology

## 2013-07-29 ENCOUNTER — Encounter (HOSPITAL_COMMUNITY): Payer: No Typology Code available for payment source | Admitting: Certified Registered Nurse Anesthetist

## 2013-07-29 DIAGNOSIS — S2242XA Multiple fractures of ribs, left side, initial encounter for closed fracture: Secondary | ICD-10-CM | POA: Diagnosis present

## 2013-07-29 DIAGNOSIS — S51812A Laceration without foreign body of left forearm, initial encounter: Secondary | ICD-10-CM | POA: Diagnosis present

## 2013-07-29 DIAGNOSIS — S3210XA Unspecified fracture of sacrum, initial encounter for closed fracture: Secondary | ICD-10-CM | POA: Diagnosis present

## 2013-07-29 DIAGNOSIS — S32591A Other specified fracture of right pubis, initial encounter for closed fracture: Secondary | ICD-10-CM | POA: Diagnosis present

## 2013-07-29 DIAGNOSIS — S72142A Displaced intertrochanteric fracture of left femur, initial encounter for closed fracture: Secondary | ICD-10-CM | POA: Diagnosis present

## 2013-07-29 DIAGNOSIS — S36115A Moderate laceration of liver, initial encounter: Secondary | ICD-10-CM | POA: Diagnosis present

## 2013-07-29 DIAGNOSIS — S2249XA Multiple fractures of ribs, unspecified side, initial encounter for closed fracture: Secondary | ICD-10-CM

## 2013-07-29 DIAGNOSIS — D62 Acute posthemorrhagic anemia: Secondary | ICD-10-CM | POA: Diagnosis present

## 2013-07-29 DIAGNOSIS — S0101XA Laceration without foreign body of scalp, initial encounter: Secondary | ICD-10-CM | POA: Diagnosis present

## 2013-07-29 DIAGNOSIS — R578 Other shock: Secondary | ICD-10-CM | POA: Diagnosis present

## 2013-07-29 DIAGNOSIS — S060XAA Concussion with loss of consciousness status unknown, initial encounter: Secondary | ICD-10-CM | POA: Diagnosis present

## 2013-07-29 DIAGNOSIS — S01312A Laceration without foreign body of left ear, initial encounter: Secondary | ICD-10-CM | POA: Diagnosis present

## 2013-07-29 DIAGNOSIS — N179 Acute kidney failure, unspecified: Secondary | ICD-10-CM

## 2013-07-29 DIAGNOSIS — S36899A Unspecified injury of other intra-abdominal organs, initial encounter: Secondary | ICD-10-CM | POA: Diagnosis present

## 2013-07-29 DIAGNOSIS — D696 Thrombocytopenia, unspecified: Secondary | ICD-10-CM | POA: Diagnosis not present

## 2013-07-29 DIAGNOSIS — J9600 Acute respiratory failure, unspecified whether with hypoxia or hypercapnia: Secondary | ICD-10-CM | POA: Diagnosis not present

## 2013-07-29 DIAGNOSIS — S37812A Contusion of adrenal gland, initial encounter: Secondary | ICD-10-CM | POA: Diagnosis present

## 2013-07-29 DIAGNOSIS — S0285XB Fracture of orbit, unspecified, initial encounter for open fracture: Secondary | ICD-10-CM | POA: Diagnosis present

## 2013-07-29 DIAGNOSIS — S32602A Unspecified fracture of left ischium, initial encounter for closed fracture: Secondary | ICD-10-CM | POA: Diagnosis present

## 2013-07-29 DIAGNOSIS — J984 Other disorders of lung: Secondary | ICD-10-CM | POA: Diagnosis present

## 2013-07-29 DIAGNOSIS — S32592A Other specified fracture of left pubis, initial encounter for closed fracture: Secondary | ICD-10-CM

## 2013-07-29 DIAGNOSIS — S060X9A Concussion with loss of consciousness of unspecified duration, initial encounter: Secondary | ICD-10-CM | POA: Diagnosis present

## 2013-07-29 HISTORY — PX: INTRAMEDULLARY (IM) NAIL INTERTROCHANTERIC: SHX5875

## 2013-07-29 LAB — BLOOD GAS, ARTERIAL
Acid-base deficit: 1.2 mmol/L (ref 0.0–2.0)
Bicarbonate: 22.4 mEq/L (ref 20.0–24.0)
Drawn by: 36274
FIO2: 40 %
MECHVT: 500 mL
O2 Saturation: 98.1 %
PCO2 ART: 33.3 mmHg — AB (ref 35.0–45.0)
PEEP: 5 cmH2O
PO2 ART: 88 mmHg (ref 80.0–100.0)
Patient temperature: 98.6
RATE: 16 resp/min
TCO2: 23.4 mmol/L (ref 0–100)
pH, Arterial: 7.443 (ref 7.350–7.450)

## 2013-07-29 LAB — CBC
HCT: 24 % — ABNORMAL LOW (ref 39.0–52.0)
HCT: 23.3 % — ABNORMAL LOW (ref 39.0–52.0)
HCT: 25 % — ABNORMAL LOW (ref 39.0–52.0)
HEMOGLOBIN: 8.6 g/dL — AB (ref 13.0–17.0)
HEMOGLOBIN: 8.7 g/dL — AB (ref 13.0–17.0)
Hemoglobin: 8.6 g/dL — ABNORMAL LOW (ref 13.0–17.0)
MCH: 29.6 pg (ref 26.0–34.0)
MCH: 30.1 pg (ref 26.0–34.0)
MCH: 28.6 pg (ref 26.0–34.0)
MCHC: 35.8 g/dL (ref 30.0–36.0)
MCHC: 36.9 g/dL — ABNORMAL HIGH (ref 30.0–36.0)
MCHC: 34.8 g/dL (ref 30.0–36.0)
MCV: 82.5 fL (ref 78.0–100.0)
MCV: 81.5 fL (ref 78.0–100.0)
MCV: 82.2 fL (ref 78.0–100.0)
Platelets: 91 10*3/uL — ABNORMAL LOW (ref 150–400)
Platelets: 89 10*3/uL — ABNORMAL LOW (ref 150–400)
Platelets: 92 10*3/uL — ABNORMAL LOW (ref 150–400)
RBC: 2.91 MIL/uL — ABNORMAL LOW (ref 4.22–5.81)
RBC: 2.86 MIL/uL — ABNORMAL LOW (ref 4.22–5.81)
RBC: 3.04 MIL/uL — AB (ref 4.22–5.81)
RDW: 15.9 % — AB (ref 11.5–15.5)
RDW: 16 % — ABNORMAL HIGH (ref 11.5–15.5)
RDW: 17.1 % — ABNORMAL HIGH (ref 11.5–15.5)
WBC: 10.4 10*3/uL (ref 4.0–10.5)
WBC: 11 10*3/uL — ABNORMAL HIGH (ref 4.0–10.5)
WBC: 10.5 10*3/uL (ref 4.0–10.5)

## 2013-07-29 LAB — PREPARE CRYOPRECIPITATE: Unit division: 0

## 2013-07-29 LAB — POCT I-STAT 7, (LYTES, BLD GAS, ICA,H+H)
BICARBONATE: 24.4 meq/L — AB (ref 20.0–24.0)
CALCIUM ION: 1.17 mmol/L (ref 1.13–1.30)
HCT: 21 % — ABNORMAL LOW (ref 39.0–52.0)
Hemoglobin: 7.1 g/dL — ABNORMAL LOW (ref 13.0–17.0)
O2 SAT: 100 %
PCO2 ART: 38.2 mmHg (ref 35.0–45.0)
PH ART: 7.413 (ref 7.350–7.450)
POTASSIUM: 3.8 meq/L (ref 3.7–5.3)
Sodium: 142 meq/L (ref 137–147)
TCO2: 26 mmol/L (ref 0–100)
pO2, Arterial: 187 mmHg — ABNORMAL HIGH (ref 80.0–100.0)

## 2013-07-29 LAB — BASIC METABOLIC PANEL
BUN: 12 mg/dL (ref 6–23)
CO2: 22 mEq/L (ref 19–32)
Calcium: 7 mg/dL — ABNORMAL LOW (ref 8.4–10.5)
Chloride: 107 mEq/L (ref 96–112)
Creatinine, Ser: 0.71 mg/dL (ref 0.50–1.35)
GFR calc Af Amer: >90 mL/min (ref >90)
GFR calc non Af Amer: 86 mL/min — ABNORMAL LOW (ref >90)
GLUCOSE: 133 mg/dL — AB (ref 70–99)
POTASSIUM: 3.8 meq/L (ref 3.7–5.3)
Sodium: 139 mEq/L (ref 137–147)

## 2013-07-29 LAB — PREPARE PLATELET PHERESIS: UNIT DIVISION: 0

## 2013-07-29 LAB — GLUCOSE, CAPILLARY
GLUCOSE-CAPILLARY: 118 mg/dL — AB (ref 70–99)
GLUCOSE-CAPILLARY: 121 mg/dL — AB (ref 70–99)

## 2013-07-29 LAB — PREPARE RBC (CROSSMATCH)

## 2013-07-29 LAB — CALCIUM, IONIZED: Calcium, Ion: 1.06 mmol/L — ABNORMAL LOW (ref 1.13–1.30)

## 2013-07-29 LAB — PROTIME-INR
INR: 1.31 (ref 0.00–1.49)
Prothrombin Time: 16 seconds — ABNORMAL HIGH (ref 11.6–15.2)

## 2013-07-29 LAB — LACTIC ACID, PLASMA: Lactic Acid, Venous: 2.1 mmol/L (ref 0.5–2.2)

## 2013-07-29 SURGERY — FIXATION, FRACTURE, INTERTROCHANTERIC, WITH INTRAMEDULLARY ROD
Anesthesia: General | Site: Hip | Laterality: Left

## 2013-07-29 MED ORDER — ADULT MULTIVITAMIN LIQUID CH
5.0000 mL | Freq: Every day | ORAL | Status: DC
  Administered 2013-07-29 – 2013-08-01 (×4): 5 mL
  Filled 2013-07-29 (×4): qty 5

## 2013-07-29 MED ORDER — MIDAZOLAM HCL 5 MG/5ML IJ SOLN
INTRAMUSCULAR | Status: DC | PRN
  Administered 2013-07-29: 2 mg via INTRAVENOUS

## 2013-07-29 MED ORDER — BUPIVACAINE-EPINEPHRINE (PF) 0.5% -1:200000 IJ SOLN
INTRAMUSCULAR | Status: AC
  Filled 2013-07-29: qty 20

## 2013-07-29 MED ORDER — MUPIROCIN CALCIUM 2 % EX CREA
TOPICAL_CREAM | CUTANEOUS | Status: AC
  Filled 2013-07-29: qty 15

## 2013-07-29 MED ORDER — PIVOT 1.5 CAL PO LIQD
1000.0000 mL | ORAL | Status: DC
  Filled 2013-07-29 (×2): qty 1000

## 2013-07-29 MED ORDER — ONDANSETRON HCL 4 MG/2ML IJ SOLN: 4.0000 mg | Freq: Once | INTRAMUSCULAR | Status: AC | PRN

## 2013-07-29 MED ORDER — PHENYLEPHRINE HCL 10 MG/ML IJ SOLN
10.0000 mg | INTRAMUSCULAR | Status: DC | PRN
Start: 2013-07-29 — End: 2013-07-29
  Administered 2013-07-29: 25 ug/min via INTRAVENOUS

## 2013-07-29 MED ORDER — 0.9 % SODIUM CHLORIDE (POUR BTL) OPTIME
TOPICAL | Status: DC | PRN
  Administered 2013-07-29: 1000 mL

## 2013-07-29 MED ORDER — MUPIROCIN CALCIUM 2 % EX CREA
TOPICAL_CREAM | CUTANEOUS | Status: DC | PRN
  Administered 2013-07-29: 1 via TOPICAL

## 2013-07-29 MED ORDER — HYDROMORPHONE HCL PF 1 MG/ML IJ SOLN: 0.2500 mg | INTRAMUSCULAR | Status: DC | PRN

## 2013-07-29 MED ORDER — LACTATED RINGERS IV SOLN
INTRAVENOUS | Status: DC | PRN
  Administered 2013-07-29: 13:00:00 via INTRAVENOUS

## 2013-07-29 MED ORDER — VITAMIN E 15 UNIT/0.3ML PO SOLN
400.0000 [IU] | Freq: Three times a day (TID) | ORAL | Status: DC
  Administered 2013-07-29 – 2013-08-01 (×9): 400 [IU]
  Filled 2013-07-29 (×12): qty 8

## 2013-07-29 MED ORDER — ROCURONIUM BROMIDE 100 MG/10ML IV SOLN
INTRAVENOUS | Status: DC | PRN
  Administered 2013-07-29: 50 mg via INTRAVENOUS

## 2013-07-29 MED ORDER — CEFAZOLIN SODIUM-DEXTROSE 2-3 GM-% IV SOLR
INTRAVENOUS | Status: DC | PRN
  Administered 2013-07-29: 2 g via INTRAVENOUS

## 2013-07-29 MED ORDER — BUPIVACAINE-EPINEPHRINE 0.5% -1:200000 IJ SOLN: INTRAMUSCULAR | Status: DC | PRN

## 2013-07-29 MED ORDER — SODIUM CHLORIDE 0.9 % IV SOLN
2.0000 g | Freq: Once | INTRAVENOUS | Status: AC
  Administered 2013-07-29: 2 g via INTRAVENOUS
  Filled 2013-07-29: qty 20

## 2013-07-29 MED ORDER — PHENYLEPHRINE HCL 10 MG/ML IJ SOLN
INTRAMUSCULAR | Status: DC | PRN
  Administered 2013-07-29 (×2): 80 ug via INTRAVENOUS

## 2013-07-29 MED ORDER — VITAMIN C 500 MG PO TABS
1000.0000 mg | ORAL_TABLET | Freq: Three times a day (TID) | ORAL | Status: DC
  Administered 2013-07-29 – 2013-08-01 (×9): 1000 mg
  Filled 2013-07-29 (×12): qty 2

## 2013-07-29 MED ORDER — PRO-STAT SUGAR FREE PO LIQD
30.0000 mL | Freq: Three times a day (TID) | ORAL | Status: DC
  Administered 2013-07-29 – 2013-08-01 (×9): 30 mL
  Filled 2013-07-29 (×11): qty 30

## 2013-07-29 MED ORDER — SELENIUM 50 MCG PO TABS
200.0000 ug | ORAL_TABLET | Freq: Every day | ORAL | Status: DC
  Administered 2013-07-29 – 2013-08-01 (×4): 200 ug
  Filled 2013-07-29 (×4): qty 4

## 2013-07-29 MED ORDER — FENTANYL CITRATE 0.05 MG/ML IJ SOLN
INTRAMUSCULAR | Status: DC | PRN
  Administered 2013-07-29 (×2): 50 ug via INTRAVENOUS
  Administered 2013-07-29: 100 ug via INTRAVENOUS
  Administered 2013-07-29: 50 ug via INTRAVENOUS

## 2013-07-29 MED ORDER — PIVOT 1.5 CAL PO LIQD
1000.0000 mL | ORAL | Status: DC
  Administered 2013-07-29 – 2013-07-31 (×2): 1000 mL
  Filled 2013-07-29 (×4): qty 1000

## 2013-07-29 MED ORDER — OXYCODONE HCL 5 MG/5ML PO SOLN: 5.0000 mg | Freq: Once | ORAL | Status: AC | PRN

## 2013-07-29 MED ORDER — MEPERIDINE HCL 25 MG/ML IJ SOLN: 6.2500 mg | INTRAMUSCULAR | Status: DC | PRN

## 2013-07-29 MED ORDER — STERILE WATER FOR IRRIGATION IR SOLN
Status: DC | PRN
  Administered 2013-07-29: 1000 mL

## 2013-07-29 MED ORDER — OXYCODONE HCL 5 MG PO TABS
5.0000 mg | ORAL_TABLET | Freq: Once | ORAL | Status: AC | PRN
Start: 2013-07-29 — End: 2013-07-29

## 2013-07-29 MED ORDER — CEFAZOLIN SODIUM-DEXTROSE 2-3 GM-% IV SOLR
2.0000 g | Freq: Once | INTRAVENOUS | Status: DC
  Filled 2013-07-29: qty 50

## 2013-07-29 SURGICAL SUPPLY — 54 items
BIT DRILL 4.3MMS DISTAL GRDTED (BIT) IMPLANT
BLADE SURG 15 STRL LF DISP TIS (BLADE) ×1 IMPLANT
BLADE SURG 15 STRL SS (BLADE) ×3
BNDG COHESIVE 4X5 TAN STRL (GAUZE/BANDAGES/DRESSINGS) ×3 IMPLANT
CLOTH BEACON ORANGE TIMEOUT ST (SAFETY) ×3 IMPLANT
CORTICAL BONE SCR 5.0MM X 48MM (Screw) ×3 IMPLANT
COVER MAYO STAND STRL (DRAPES) ×3 IMPLANT
COVER SURGICAL LIGHT HANDLE (MISCELLANEOUS) ×3 IMPLANT
COVER TABLE BACK 60X90 (DRAPES) ×3 IMPLANT
DRAPE C-ARM 42X72 X-RAY (DRAPES) ×3 IMPLANT
DRAPE PROXIMA HALF (DRAPES) ×4 IMPLANT
DRAPE STERI IOBAN 125X83 (DRAPES) ×3 IMPLANT
DRILL 4.3MMS DISTAL GRADUATED (BIT) ×3
DRSG EMULSION OIL 3X3 NADH (GAUZE/BANDAGES/DRESSINGS) ×2 IMPLANT
DRSG PAD ABDOMINAL 8X10 ST (GAUZE/BANDAGES/DRESSINGS) ×5 IMPLANT
DURAPREP 26ML APPLICATOR (WOUND CARE) ×3 IMPLANT
ELECT REM PT RETURN 9FT ADLT (ELECTROSURGICAL) ×3
ELECTRODE REM PT RTRN 9FT ADLT (ELECTROSURGICAL) ×1 IMPLANT
EVACUATOR 1/8 PVC DRAIN (DRAIN) IMPLANT
GAUZE XEROFORM 1X8 LF (GAUZE/BANDAGES/DRESSINGS) ×5 IMPLANT
GLOVE BIO SURGEON STRL SZ7 (GLOVE) ×2 IMPLANT
GLOVE BIO SURGEON STRL SZ7.5 (GLOVE) ×2 IMPLANT
GLOVE BIOGEL PI IND STRL 7.5 (GLOVE) IMPLANT
GLOVE BIOGEL PI IND STRL 8 (GLOVE) ×1 IMPLANT
GLOVE BIOGEL PI INDICATOR 7.5 (GLOVE) ×2
GLOVE BIOGEL PI INDICATOR 8 (GLOVE) ×2
GLOVE ORTHO TXT STRL SZ7.5 (GLOVE) ×3 IMPLANT
GOWN PREVENTION PLUS LG XLONG (DISPOSABLE) IMPLANT
GOWN PREVENTION PLUS XLARGE (GOWN DISPOSABLE) ×3 IMPLANT
GOWN STRL NON-REIN LRG LVL3 (GOWN DISPOSABLE) ×6 IMPLANT
GUIDEWIRE BALL NOSE 100CM (WIRE) ×2 IMPLANT
HFN A/R SCREW 90MM (Screw) ×2 IMPLANT
HFN LH 130 DEG 11MM X 380MM (Orthopedic Implant) ×2 IMPLANT
HIP FR NAIL LAG SCREW 10.5X110 (Orthopedic Implant) ×3 IMPLANT
KIT BASIN OR (CUSTOM PROCEDURE TRAY) ×3 IMPLANT
KIT ROOM TURNOVER OR (KITS) ×3 IMPLANT
MANIFOLD NEPTUNE II (INSTRUMENTS) ×3 IMPLANT
NS IRRIG 1000ML POUR BTL (IV SOLUTION) ×3 IMPLANT
PACK GENERAL/GYN (CUSTOM PROCEDURE TRAY) ×3 IMPLANT
PAD ABD 8X10 STRL (GAUZE/BANDAGES/DRESSINGS) ×2 IMPLANT
PAD ARMBOARD 7.5X6 YLW CONV (MISCELLANEOUS) ×6 IMPLANT
SCREW CORTICL BON 5.0MM X 48MM (Screw) IMPLANT
SCREW DRILL BIT ANIT ROTATION (BIT) ×2 IMPLANT
SCREW LAG HIP FR NAIL 10.5X110 (Orthopedic Implant) IMPLANT
SPONGE GAUZE 4X4 12PLY (GAUZE/BANDAGES/DRESSINGS) ×7 IMPLANT
STAPLER VISISTAT 35W (STAPLE) ×3 IMPLANT
SUT VIC AB 0 CT1 27 (SUTURE) ×3
SUT VIC AB 0 CT1 27XBRD ANBCTR (SUTURE) ×1 IMPLANT
SUT VIC AB 1 CT1 27 (SUTURE) ×3
SUT VIC AB 1 CT1 27XBRD ANBCTR (SUTURE) IMPLANT
SUT VIC AB 2-0 CT1 27 (SUTURE) ×6
SUT VIC AB 2-0 CT1 TAPERPNT 27 (SUTURE) ×2 IMPLANT
TOWEL OR NON WOVEN STRL DISP B (DISPOSABLE) ×4 IMPLANT
WATER STERILE IRR 1000ML POUR (IV SOLUTION) ×6 IMPLANT

## 2013-07-29 NOTE — Brief Op Note (Signed)
07/27/2013 - 07/29/2013  2:18 PM  PATIENT:  [name] [name]  80 y.o. male  PRE-OPERATIVE DIAGNOSIS:  Intertrochanteric Fracture Left Hip  POST-OPERATIVE DIAGNOSIS:  Intertrochanteric Fracture Left Hip  PROCEDURE:  Procedure(s) with comments: INTRAMEDULLARY (IM) NAIL INTERTROCHANTRIC (Left) - IM Nail Left Hip  SURGEON:  Surgeon(s) and Role:    * Eldred MangesMark C Yates, MD - Primary  PHYSICIAN ASSISTANT: Maud DeedSheila Javaughn Opdahl [facility]  ASSISTANTS: none   ANESTHESIA:   general  EBL:  Total I/O In: 445.3 [I.V.:325.3; IV Piggyback:120] Out: 320 [Urine:220; Blood:100]  BLOOD ADMINISTERED:none  DRAINS: none   LOCAL MEDICATIONS USED:  NONE  SPECIMEN:  No Specimen  DISPOSITION OF SPECIMEN:  N/A  COUNTS:  YES  TOURNIQUET:  * No tourniquets in log *  DICTATION: .Note written in EPIC  PLAN OF CARE: Admit to inpatient   PATIENT DISPOSITION:  PACU - hemodynamically stable.   Delay start of Pharmacological VTE agent (>24hrs) due to surgical blood loss or risk of bleeding: no

## 2013-07-29 NOTE — Anesthesia Preprocedure Evaluation (Addendum)
Anesthesia Evaluation  Patient identified by MRN, date of birth, ID band Patient unresponsive    Reviewed: Allergy & Precautions, H&P , NPO status , Patient's Chart, lab work & pertinent test results  Airway       Dental   Pulmonary          Cardiovascular     Neuro/Psych    GI/Hepatic   Endo/Other    Renal/GU      Musculoskeletal   Abdominal   Peds  Hematology   Anesthesia Other Findings   Reproductive/Obstetrics                          Anesthesia Physical Anesthesia Plan  ASA: III  Anesthesia Plan: General   Post-op Pain Management:    Induction: Inhalational and Intravenous  Airway Management Planned: Oral ETT  Additional Equipment: Arterial line  Intra-op Plan:   Post-operative Plan: Post-operative intubation/ventilation  Informed Consent: I have reviewed the patients History and Physical, chart, labs and discussed the procedure including the risks, benefits and alternatives for the proposed anesthesia with the patient or authorized representative who has indicated his/her understanding and acceptance.   History available from chart only  Plan Discussed with: CRNA and Surgeon  Anesthesia Plan Comments:        Anesthesia Quick Evaluation

## 2013-07-29 NOTE — Anesthesia Postprocedure Evaluation (Signed)
  Anesthesia Post-op Note  Patient: [name] [name]  Procedure(s) Performed: Procedure(s): INTRAMEDULLARY (IM) NAIL INTERTROCHANTRIC (Left)  Patient Location: NICU  Anesthesia Type:General  Level of Consciousness: Patient remains intubated per anesthesia plan  Airway and Oxygen Therapy: Patient remains intubated per anesthesia plan and Patient placed on Ventilator (see vital sign flow sheet for setting)  Post-op Pain: none  Post-op Assessment: Post-op Vital signs reviewed, Patient's Cardiovascular Status Stable, Respiratory Function Stable and Pain level controlled  Post-op Vital Signs: stable  Complications: No apparent anesthesia complications

## 2013-07-29 NOTE — Preoperative (Signed)
Beta Blockers   Reason not to administer Beta Blockers:Not Applicable 

## 2013-07-29 NOTE — Progress Notes (Signed)
Pt. returned from OR @ this time, placed back on previous ventilator settings, order in for wean as tolerated, currently remains paralyzed/sedated from surgery, RT to monitor.

## 2013-07-29 NOTE — Progress Notes (Signed)
Subjective: 2 Days Post-Op Procedure(s) (LRB): THORACIC AORTIC ENDOVASCULAR STENT GRAFT (N/A) IRRIGATION AND DEBRIDEMENT ELBOW (Left) Closure of Right Eye Lacerationl; Closure of Left Post-Auricular Laceration Femoral Pin Traction (Left) Patient reports pain as pt sedated/intubated.    Objective: Vital signs in last 24 hours: Temp:  [99.3 F (37.4 C)-100.4 F (38 C)] 99.3 F (37.4 C) (01/14 0800) Pulse Rate:  [102-116] 104 (01/14 0800) Resp:  [16-22] 16 (01/14 0800) BP: (106-155)/(58-101) 136/73 mmHg (01/14 0800) SpO2:  [100 %] 100 % (01/14 0800) Arterial Line BP: (122-196)/(48-85) 187/67 mmHg (01/14 0800) FiO2 (%):  [40 %-50 %] 40 % (01/14 0901)  Intake/Output from previous day: 01/13 0701 - 01/14 0700 In: 2545.3 [I.V.:2177.8; Blood:317.5; IV Piggyback:50] Out: 1040 [Urine:990; Emesis/NG output:50] Intake/Output this shift: Total I/O In: 80.7 [I.V.:80.7] Out: 0    Recent Labs  07/28/13 0421 07/28/13 0949 07/28/13 1600 07/28/13 2200 07/29/13 0400  HGB 8.4* 9.8* 10.2* 9.2* 8.6*    Recent Labs  07/28/13 2200 07/29/13 0400  WBC 9.6 10.4  RBC 3.12* 2.91*  HCT 25.4* 24.0*  PLT 97* 91*    Recent Labs  07/28/13 0421 07/29/13 0400  NA 142 139  K 4.4 3.8  CL 109 107  CO2 19 22  BUN 17 12  CREATININE 0.84 0.71  GLUCOSE 172* 133*  CALCIUM 6.7* 7.0*    Recent Labs  07/28/13 0421 07/29/13 0400  INR 1.30 1.31    in traction  Assessment/Plan: 2 Days Post-Op Procedure(s) (LRB): THORACIC AORTIC ENDOVASCULAR STENT GRAFT (N/A) IRRIGATION AND DEBRIDEMENT ELBOW (Left) Closure of Right Eye Lacerationl; Closure of Left Post-Auricular Laceration Femoral Pin Traction (Left) To OR today for nailing of left femur fracture.  PLT stabilizing and INR1.3  [name] [name] M 07/29/2013, 11:18 AM

## 2013-07-29 NOTE — H&P (View-Only) (Signed)
Patient ID: [name] DewJerry Banker, male   DOB: 10/06/1930, 80 y.o.   MRN: [id number] INR better 1.4 improved, only one unit blood since OR.  Platelets improved.  Will post tomorrow for 12:30 PM surgery for left trochanteric nail fixation of his unstable femur fracture.  Hopefully his status will be stable and coninue to improve.

## 2013-07-29 NOTE — Progress Notes (Addendum)
[facility] [place]  Subjective  - Intubated and sedated.    Objective 132/70 102 99.5 F (37.5 C) (Core (Comment)) 16 100%  Intake/Output Summary (Last 24 hours) at 07/29/13 0817 Last data filed at 07/29/13 0600  Gross per 24 hour  Intake 2269.86 ml  Output   1040 ml  Net 1229.86 ml    Abdomin soft neg BS Lungs CTA Palpable right DP/PT, left doppler 2+ signal Incision dressing dry no active drainage   Assessment/Planning: POD #2 TVAR Stable from a thoracic point    [name] GallantCOLLINS, [name] [facility] CenterMAUREEN 07/29/2013 8:17 AM --  Laboratory Lab Results:  Recent Labs  07/28/13 2200 07/29/13 0400  WBC 9.6 10.4  HGB 9.2* 8.6*  HCT 25.4* 24.0*  PLT 97* 91*   BMET  Recent Labs  07/28/13 0421 07/29/13 0400  NA 142 139  K 4.4 3.8  CL 109 107  CO2 19 22  GLUCOSE 172* 133*  BUN 17 12  CREATININE 0.84 0.71  CALCIUM 6.7* 7.0*    COAG Lab Results  Component Value Date   INR 1.31 07/29/2013   INR 1.30 07/28/2013   INR 1.43 07/27/2013   No results found for this basename: PTT   Addendum  I have independently interviewed and examined the patient, and I agree with the physician assistant's findings.  Pt still intubated and sedated.  Dopplerable left radial signal.  Stent graft in good position.  Once medically stable, will need repeat CTA chest to evaluate thoracic aorta for Type II endoleak from SCA.  Nothing immediately from vascular viewpoint.  Leonides SakeBrian Chen, MD [facility] [place] Office: [phone] Pager: [phone]  07/29/2013, 9:01 AM

## 2013-07-29 NOTE — Anesthesia Procedure Notes (Addendum)
Procedures

## 2013-07-29 NOTE — Progress Notes (Signed)
Pt. breathing over set rate, SBT done-passed, currently on 5-cpap/5-pressure support/40% fi02, Volumes good, RT to monitor.

## 2013-07-29 NOTE — Transfer of Care (Signed)
Immediate Anesthesia Transfer of Care Note  Patient: [name] [name]  Procedure(s) Performed: Procedure(s): INTRAMEDULLARY (IM) NAIL INTERTROCHANTRIC (Left)  Patient Location: ICU  Anesthesia Type:General  Level of Consciousness: sedated, unresponsive and Patient remains intubated per anesthesia plan  Airway & Oxygen Therapy: Patient remains intubated per anesthesia plan and Patient placed on Ventilator (see vital sign flow sheet for setting)  Post-op Assessment: Report given to PACU RN and Post -op Vital signs reviewed and stable  Post vital signs: Reviewed and stable  Complications: No apparent anesthesia complications
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
## 2013-07-29 NOTE — Progress Notes (Signed)
Hemoglobin is down a bit.  Should have blood available for the OR.  Otherwise stable.''This patient has been seen and I agree with the findings and treatment plan.  Marta LamasJames O. Gae BonWyatt, III, MD, FACS [phone] (pager) [phone] (direct pager) Trauma Surgeon
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
## 2013-07-29 NOTE — Interval H&P Note (Signed)
History and Physical Interval Note:  07/29/2013 12:17 PM  [name] [name]  has presented today for surgery, with the diagnosis of Intertrochanteric Fracture Left Hip  The various methods of treatment have been discussed with the patient and family. After consideration of risks, benefits and other options for treatment, the patient has consented to  Procedure(s) with comments: INTRAMEDULLARY (IM) NAIL INTERTROCHANTRIC (Left) - IM Nail Left Hip as a surgical intervention .  The patient's history has been reviewed, patient examined, no change in status, stable for surgery.  I have reviewed the patient's chart and labs.  Questions were answered to the patient's satisfaction.     PATIENT IS INTUBATED, ORIGINAL SURGERY EMERGENCY. THIS IS COMPLETION OF PROCEDURE, PATIENT GAVE INFORMED CONSENT BEFORE INITIAL SURGERY AND HAS BEEN INTUBATED.   [name] C

## 2013-07-30 ENCOUNTER — Encounter (HOSPITAL_COMMUNITY): Payer: Self-pay | Admitting: Otolaryngology

## 2013-07-30 ENCOUNTER — Inpatient Hospital Stay (HOSPITAL_COMMUNITY): Payer: No Typology Code available for payment source

## 2013-07-30 LAB — CBC WITH DIFFERENTIAL/PLATELET
BASOS ABS: 0 10*3/uL (ref 0.0–0.1)
BASOS PCT: 0 % (ref 0–1)
EOS ABS: 0.1 10*3/uL (ref 0.0–0.7)
Eosinophils Relative: 1 % (ref 0–5)
HCT: 24.3 % — ABNORMAL LOW (ref 39.0–52.0)
Hemoglobin: 8.2 g/dL — ABNORMAL LOW (ref 13.0–17.0)
Lymphocytes Relative: 9 % — ABNORMAL LOW (ref 12–46)
Lymphs Abs: 0.8 10*3/uL (ref 0.7–4.0)
MCH: 28.2 pg (ref 26.0–34.0)
MCHC: 33.7 g/dL (ref 30.0–36.0)
MCV: 83.5 fL (ref 78.0–100.0)
MONOS PCT: 12 % (ref 3–12)
Monocytes Absolute: 1 10*3/uL (ref 0.1–1.0)
NEUTROS PCT: 79 % — AB (ref 43–77)
Neutro Abs: 6.9 10*3/uL (ref 1.7–7.7)
PLATELETS: 71 10*3/uL — AB (ref 150–400)
RBC: 2.91 MIL/uL — ABNORMAL LOW (ref 4.22–5.81)
RDW: 17.5 % — ABNORMAL HIGH (ref 11.5–15.5)
WBC: 8.8 10*3/uL (ref 4.0–10.5)

## 2013-07-30 LAB — BASIC METABOLIC PANEL
BUN: 17 mg/dL (ref 6–23)
CALCIUM: 7.3 mg/dL — AB (ref 8.4–10.5)
CO2: 22 mEq/L (ref 19–32)
Chloride: 113 mEq/L — ABNORMAL HIGH (ref 96–112)
Creatinine, Ser: 0.66 mg/dL (ref 0.50–1.35)
GFR calc non Af Amer: 89 mL/min — ABNORMAL LOW (ref >90)
GLUCOSE: 159 mg/dL — AB (ref 70–99)
POTASSIUM: 3.7 meq/L (ref 3.7–5.3)
SODIUM: 143 meq/L (ref 137–147)

## 2013-07-30 LAB — CBC
HCT: 24.7 % — ABNORMAL LOW (ref 39.0–52.0)
HEMOGLOBIN: 8.5 g/dL — AB (ref 13.0–17.0)
MCH: 28.5 pg (ref 26.0–34.0)
MCHC: 34.4 g/dL (ref 30.0–36.0)
MCV: 82.9 fL (ref 78.0–100.0)
Platelets: 75 10*3/uL — ABNORMAL LOW (ref 150–400)
RBC: 2.98 MIL/uL — ABNORMAL LOW (ref 4.22–5.81)
RDW: 17.6 % — ABNORMAL HIGH (ref 11.5–15.5)
WBC: 9.1 10*3/uL (ref 4.0–10.5)

## 2013-07-30 LAB — GLUCOSE, CAPILLARY
GLUCOSE-CAPILLARY: 115 mg/dL — AB (ref 70–99)
GLUCOSE-CAPILLARY: 120 mg/dL — AB (ref 70–99)
GLUCOSE-CAPILLARY: 124 mg/dL — AB (ref 70–99)
Glucose-Capillary: 140 mg/dL — ABNORMAL HIGH (ref 70–99)
Glucose-Capillary: 147 mg/dL — ABNORMAL HIGH (ref 70–99)

## 2013-07-30 LAB — TRIGLYCERIDES: TRIGLYCERIDES: 211 mg/dL — AB (ref <150)

## 2013-07-30 MED ORDER — INSULIN ASPART 100 UNIT/ML ~~LOC~~ SOLN
0.0000 [IU] | SUBCUTANEOUS | Status: DC
  Administered 2013-07-30 – 2013-07-31 (×6): 1 [IU] via SUBCUTANEOUS

## 2013-07-30 MED ORDER — SODIUM CHLORIDE 0.9 % IV SOLN
1.0000 g | Freq: Once | INTRAVENOUS | Status: AC
  Administered 2013-07-30: 1 g via INTRAVENOUS
  Filled 2013-07-30: qty 10

## 2013-07-30 NOTE — Clinical Social Work Note (Signed)
Clinical Social Worker continuing to follow patient and family for support and discharge planning needs.  Patient remains on the ventilator at this time but has been able to wean for periods of time.  Per MD, plan is to continue to wean patient, hopefully to extubation and if not tracheostomy will be placed.  Patient family available for patient at discharge and have agreed to have him move back into the home once medically appropriate for discharge home.  CSW remains available for support and to facilitate patient discharge needs once medically ready.  [name], [place] [phone]
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
## 2013-07-30 NOTE — Progress Notes (Signed)
[facility]  Subjective  - Intubated.   Objective 172/65 106 99.9 F (37.7 C) (Core (Comment)) 22 99%  Intake/Output Summary (Last 24 hours) at 07/30/13 0847 Last data filed at 07/30/13 0800  Gross per 24 hour  Intake 3101.75 ml  Output    950 ml  Net 2151.75 ml    Abd soft + BS Radial doppler signal left and right reading A line. Right groin dressing C/D  Assessment/Planning: POD # TVAR  Stable from a thoracic point Plan repeat CTA chest to evaluate thoracic aorta for Type II endoleak from SCA. Nothing immediately from vascular viewpoint.     Clinton GallantCOLLINS, EMMA [facility] 07/30/2013 8:47 AM --  Laboratory Lab Results:  Recent Labs  07/29/13 2000 07/30/13 0430  WBC 10.5 9.1  HGB 8.7* 8.5*  HCT 25.0* 24.7*  PLT 92* 75*   BMET  Recent Labs  07/29/13 0400 07/29/13 1408 07/30/13 0430  NA 139 142 143  K 3.8 3.8 3.7  CL 107  --  113*  CO2 22  --  22  GLUCOSE 133*  --  159*  BUN 12  --  17  CREATININE 0.71  --  0.66  CALCIUM 7.0*  --  7.3*    COAG Lab Results  Component Value Date   INR 1.31 07/29/2013   INR 1.30 07/28/2013   INR 1.43 07/27/2013   No results found for this basename: PTT
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
## 2013-07-30 NOTE — Progress Notes (Signed)
    Still intubated and sedated.  No neuro exam.  Dopplerable left radial.   - once pt regains consciousness, a neuro exam will be needed to evaluate for stroke and left arm neuro function (in regards to coverage of L SCA) - at some point, CTA chest will be need to evaluate thoracic stent graft repair for Type II endoleak  Leonides SakeBrian Phelan Schadt, MD [facility] [place] Office: [phone] Pager: [phone]  07/30/2013, 8:48 AM

## 2013-07-30 NOTE — Progress Notes (Addendum)
Ex-wife was found in the room trimming patient toenails. Pt. Toenails are yellow, thick and deformed. Advised the ex wife that she should wait to trim his toes after we have spoken to the physician in the morning.

## 2013-07-31 ENCOUNTER — Inpatient Hospital Stay (HOSPITAL_COMMUNITY): Payer: No Typology Code available for payment source

## 2013-07-31 ENCOUNTER — Encounter (HOSPITAL_COMMUNITY): Payer: Self-pay | Admitting: Orthopaedic Surgery

## 2013-07-31 LAB — TYPE AND SCREEN
ABO/RH(D): B POS
ANTIBODY SCREEN: NEGATIVE
UNIT DIVISION: 0
UNIT DIVISION: 0
UNIT DIVISION: 0
UNIT DIVISION: 0
UNIT DIVISION: 0
UNIT DIVISION: 0
UNIT DIVISION: 0
UNIT DIVISION: 0
UNIT DIVISION: 0
UNIT DIVISION: 0
Unit division: 0
Unit division: 0
Unit division: 0
Unit division: 0
Unit division: 0
Unit division: 0
Unit division: 0
Unit division: 0
Unit division: 0

## 2013-07-31 LAB — GLUCOSE, CAPILLARY
GLUCOSE-CAPILLARY: 107 mg/dL — AB (ref 70–99)
GLUCOSE-CAPILLARY: 132 mg/dL — AB (ref 70–99)
Glucose-Capillary: 134 mg/dL — ABNORMAL HIGH (ref 70–99)
Glucose-Capillary: 137 mg/dL — ABNORMAL HIGH (ref 70–99)
Glucose-Capillary: 130 mg/dL — ABNORMAL HIGH (ref 70–99)
Glucose-Capillary: 116 mg/dL — ABNORMAL HIGH (ref 70–99)

## 2013-07-31 LAB — BASIC METABOLIC PANEL
BUN: 23 mg/dL (ref 6–23)
CALCIUM: 7.6 mg/dL — AB (ref 8.4–10.5)
CO2: 23 mEq/L (ref 19–32)
CREATININE: 0.6 mg/dL (ref 0.50–1.35)
Chloride: 112 mEq/L (ref 96–112)
GLUCOSE: 128 mg/dL — AB (ref 70–99)
POTASSIUM: 3.5 meq/L — AB (ref 3.7–5.3)
Sodium: 144 mEq/L (ref 137–147)

## 2013-07-31 LAB — CBC
HCT: 23.6 % — ABNORMAL LOW (ref 39.0–52.0)
HEMOGLOBIN: 7.9 g/dL — AB (ref 13.0–17.0)
MCH: 28.3 pg (ref 26.0–34.0)
MCHC: 33.5 g/dL (ref 30.0–36.0)
MCV: 84.6 fL (ref 78.0–100.0)
Platelets: 78 10*3/uL — ABNORMAL LOW (ref 150–400)
RBC: 2.79 MIL/uL — ABNORMAL LOW (ref 4.22–5.81)
RDW: 17.6 % — AB (ref 11.5–15.5)
WBC: 8.9 10*3/uL (ref 4.0–10.5)

## 2013-07-31 LAB — CALCIUM, IONIZED: Calcium, Ion: 1.13 mmol/L (ref 1.13–1.30)

## 2013-07-31 MED ORDER — FUROSEMIDE 10 MG/ML IJ SOLN
20.0000 mg | Freq: Once | INTRAMUSCULAR | Status: AC
  Administered 2013-07-31: 20 mg via INTRAVENOUS

## 2013-07-31 MED ORDER — FUROSEMIDE 10 MG/ML IJ SOLN
INTRAMUSCULAR | Status: AC
  Filled 2013-07-31: qty 4

## 2013-07-31 MED ORDER — POTASSIUM CHLORIDE 10 MEQ/50ML IV SOLN
10.0000 meq | INTRAVENOUS | Status: AC
  Administered 2013-07-31 (×4): 10 meq via INTRAVENOUS
  Filled 2013-07-31 (×4): qty 50

## 2013-07-31 MED ORDER — QUETIAPINE FUMARATE 50 MG PO TABS
50.0000 mg | ORAL_TABLET | Freq: Two times a day (BID) | ORAL | Status: DC
Start: 2013-07-31 — End: 2013-08-04
  Administered 2013-07-31 – 2013-08-03 (×6): 50 mg via ORAL
  Filled 2013-07-31 (×10): qty 1

## 2013-07-31 MED ORDER — METOPROLOL TARTRATE 25 MG/10 ML ORAL SUSPENSION
12.5000 mg | Freq: Two times a day (BID) | ORAL | Status: DC
  Administered 2013-07-31 – 2013-08-01 (×3): 12.5 mg
  Filled 2013-07-31 (×4): qty 5

## 2013-07-31 NOTE — Progress Notes (Signed)
UR completed.  Tyshana Nishida, RN BSN [facility] CCM Trauma/Neuro ICU Case Manager [phone]  

## 2013-07-31 NOTE — Anesthesia Postprocedure Evaluation (Signed)
  Anesthesia Post-op Note  Patient: [name] [name]  Procedure(s) Performed: Procedure(s): THORACIC AORTIC ENDOVASCULAR STENT GRAFT (N/A) IRRIGATION AND DEBRIDEMENT ELBOW (Left) Closure of Right Eye Lacerationl; Closure of Left Post-Auricular Laceration Femoral Pin Traction (Left)  Patient Location: ICU  Anesthesia Type:General  Level of Consciousness: sedated and Patient remains intubated per anesthesia plan  Airway and Oxygen Therapy: Patient remains intubated per anesthesia plan  Post-op Pain: none  Post-op Assessment: Post-op Vital signs reviewed and Patient's Cardiovascular Status Stable  Post-op Vital Signs: Reviewed and stable  Complications: No apparent anesthesia complications

## 2013-07-31 NOTE — Progress Notes (Signed)
Subjective: 2 Days Post-Op Procedure(s) (LRB): INTRAMEDULLARY (IM) NAIL INTERTROCHANTRIC (Left) Patient reports pain as Pt intubated.    Objective: Vital signs in last 24 hours: Temp:  [98.8 F (37.1 C)-100.9 F (38.3 C)] 99.3 F (37.4 C) (01/16 0830) Pulse Rate:  [91-117] 114 (01/16 0830) Resp:  [19-24] 23 (01/16 0830) BP: (122-190)/(56-90) 190/90 mmHg (01/16 0830) SpO2:  [95 %-100 %] 96 % (01/16 0830) Arterial Line BP: (100-234)/(56-160) 101/96 mmHg (01/15 2100) FiO2 (%):  [40 %] 40 % (01/16 0830) Weight:  [73.7 kg (162 lb 7.7 oz)] 73.7 kg (162 lb 7.7 oz) (01/16 0500)  Intake/Output from previous day: 01/15 0701 - 01/16 0700 In: 1979.3 [I.V.:1529.3; NG/GT:450] Out: 995 [Urine:995] Intake/Output this shift: Total I/O In: 171.4 [I.V.:61.4; NG/GT:60; IV Piggyback:50] Out: 425 [Urine:425]   Recent Labs  07/29/13 1408 07/29/13 2000 07/30/13 0430 07/30/13 1400 07/31/13 0222  HGB 7.1* 8.7* 8.5* 8.2* 7.9*    Recent Labs  07/30/13 1400 07/31/13 0222  WBC 8.8 8.9  RBC 2.91* 2.79*  HCT 24.3* 23.6*  PLT 71* 78*    Recent Labs  07/30/13 0430 07/31/13 0222  NA 143 144  K 3.7 3.5*  CL 113* 112  CO2 22 23  BUN 17 23  CREATININE 0.66 0.60  GLUCOSE 159* 128*  CALCIUM 7.3* 7.6*    Recent Labs  07/29/13 0400  INR 1.31    Incision: dressing of left arm and leg changed earlier today.  wounds continue to drain clear serosanq drainage.  Minimal edema of left arm.  Left thigh with mod edema and ecchymosis.  Large amt scrotal ecchymosis. Distal pulse intact bil.  Internal rotation of left leg noted.  Use pillow for support.  Moving leg to neutral position will not interfere with fixation of hip.   Assessment/Plan: 2 Days Post-Op Procedure(s) (LRB): INTRAMEDULLARY (IM) NAIL INTERTROCHANTRIC (Left) Pt has titanium nail in femur which will not interfere with MRI of Cspine.  However he has staples of the left arm and thigh. Will get post op xray of left hip. Elevate  scrotum if possible Pillow support of left leg.  [name] [name] M 07/31/2013, 9:07 AM
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
## 2013-07-31 NOTE — Progress Notes (Signed)
[facility]  Subjective  - Intubated.  Plan to wean off as tolerates.   Objective 146/63 107 99.3 F (37.4 C) (Core (Comment)) 22 99%  Intake/Output Summary (Last 24 hours) at 07/31/13 0756 Last data filed at 07/31/13 0700  Gross per 24 hour  Intake 1979.3 ml  Output    945 ml  Net 1034.3 ml    Doppler pulses LE and upper extremities. Skin warm well perfused. Abdomin soft  Assessment/Planning: TVAR  Stable from a thoracic point  Plan repeat CTA chest to evaluate thoracic aorta for Type II endoleak from SCA. Nothing immediately from vascular viewpoint.   Clinton GallantCOLLINS, Estes [facility] Co[facility]ran DivisionMAUREEN 07/31/2013 7:56 AM --  Laboratory Lab Results:  Recent Labs  07/30/13 1400 07/31/13 0222  WBC 8.8 8.9  HGB 8.2* 7.9*  HCT 24.3* 23.6*  PLT 71* 78*   BMET  Recent Labs  07/30/13 0430 07/31/13 0222  NA 143 144  K 3.7 3.5*  CL 113* 112  CO2 22 23  GLUCOSE 159* 128*  BUN 17 23  CREATININE 0.66 0.60  CALCIUM 7.3* 7.6*    COAG Lab Results  Component Value Date   INR 1.31 07/29/2013   INR 1.30 07/28/2013   INR 1.43 07/27/2013   No results found for this basename: PTT

## 2013-08-01 ENCOUNTER — Inpatient Hospital Stay (HOSPITAL_COMMUNITY): Payer: No Typology Code available for payment source

## 2013-08-01 LAB — GLUCOSE, CAPILLARY
GLUCOSE-CAPILLARY: 110 mg/dL — AB (ref 70–99)
GLUCOSE-CAPILLARY: 114 mg/dL — AB (ref 70–99)
Glucose-Capillary: 104 mg/dL — ABNORMAL HIGH (ref 70–99)

## 2013-08-01 LAB — CBC
HEMATOCRIT: 22.1 % — AB (ref 39.0–52.0)
HEMOGLOBIN: 7.3 g/dL — AB (ref 13.0–17.0)
MCH: 28.6 pg (ref 26.0–34.0)
MCHC: 33 g/dL (ref 30.0–36.0)
MCV: 86.7 fL (ref 78.0–100.0)
Platelets: 94 10*3/uL — ABNORMAL LOW (ref 150–400)
RBC: 2.55 MIL/uL — AB (ref 4.22–5.81)
RDW: 18 % — ABNORMAL HIGH (ref 11.5–15.5)
WBC: 8.2 10*3/uL (ref 4.0–10.5)

## 2013-08-01 LAB — BASIC METABOLIC PANEL
BUN: 31 mg/dL — AB (ref 6–23)
CHLORIDE: 115 meq/L — AB (ref 96–112)
CO2: 26 meq/L (ref 19–32)
Calcium: 7.6 mg/dL — ABNORMAL LOW (ref 8.4–10.5)
Creatinine, Ser: 0.57 mg/dL (ref 0.50–1.35)
GFR calc Af Amer: >90 mL/min (ref >90)
GFR calc non Af Amer: >90 mL/min (ref >90)
GLUCOSE: 136 mg/dL — AB (ref 70–99)
POTASSIUM: 3.8 meq/L (ref 3.7–5.3)
Sodium: 150 mEq/L — ABNORMAL HIGH (ref 137–147)

## 2013-08-01 MED ORDER — METOPROLOL TARTRATE 1 MG/ML IV SOLN
5.0000 mg | INTRAVENOUS | Status: DC | PRN
  Administered 2013-08-01 – 2013-08-03 (×5): 5 mg via INTRAVENOUS
  Filled 2013-08-01 (×7): qty 5

## 2013-08-01 MED ORDER — METOPROLOL TARTRATE 25 MG/10 ML ORAL SUSPENSION
12.5000 mg | Freq: Two times a day (BID) | ORAL | Status: DC
  Filled 2013-08-01 (×3): qty 5

## 2013-08-01 MED ORDER — SODIUM CHLORIDE 0.9 % IV SOLN
2.0000 g | Freq: Once | INTRAVENOUS | Status: AC
  Administered 2013-08-01: 2 g via INTRAVENOUS
  Filled 2013-08-01: qty 20

## 2013-08-01 MED ORDER — FUROSEMIDE 10 MG/ML IJ SOLN
40.0000 mg | Freq: Once | INTRAMUSCULAR | Status: AC
  Administered 2013-08-01: 40 mg via INTRAVENOUS
  Filled 2013-08-01: qty 4

## 2013-08-01 NOTE — Progress Notes (Signed)
Extubated. Follows commands. Weak voice Palpable pulses and Doppler signals throughout Ice chips only til more alert. If still weak voice/cough - may need SLP to evaluate swallow  [name] SellaEric M. Andrey CampanileWilson, MD, FACS General, Bariatric, & Minimally Invasive Surgery [facility], [place]
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
## 2013-08-01 NOTE — Progress Notes (Signed)
Patient had secretions in the back of throat and encouraged to cough. Suctioned the back of throat to get secretions out. Patient oxygenating well. Will continue to monitor.

## 2013-08-01 NOTE — Procedures (Signed)
Extubation Procedure Note  Patient Details:   Name: [name] [name] DOB: 11/16/1930 MRN: [id number]   Airway Documentation:     Evaluation  O2 sats: stable throughout Complications: No apparent complications Patient did tolerate procedure well. Bilateral Breath Sounds: Rhonchi Suctioning: Airway No Patient tolerated wean. MD ordered to extubate. Positive for cuff leak. Patient extubated to a 4 Lpm nasal cannula. No signs of dyspnea or stridor. Patient resting comfortably. Daughter at bedside during procedure. Will continue to monitor.   Ancil BoozerSmallwood, Jacai Kipp 08/01/2013, 11:01 AM

## 2013-08-01 NOTE — Progress Notes (Signed)
Patient desating upon arrival with audible secretions in airway. Patient encouraged to cough, however, not good effort. RT nasal tracheal suctioned patient with a good return in sats and better air movement noted. Vitals stable throughout. Will continue to monitor.

## 2013-08-02 ENCOUNTER — Inpatient Hospital Stay (HOSPITAL_COMMUNITY): Payer: No Typology Code available for payment source

## 2013-08-02 LAB — CBC
HCT: 24 % — ABNORMAL LOW (ref 39.0–52.0)
Hemoglobin: 8 g/dL — ABNORMAL LOW (ref 13.0–17.0)
MCH: 29.2 pg (ref 26.0–34.0)
MCHC: 33.3 g/dL (ref 30.0–36.0)
MCV: 87.6 fL (ref 78.0–100.0)
PLATELETS: 154 10*3/uL (ref 150–400)
RBC: 2.74 MIL/uL — AB (ref 4.22–5.81)
RDW: 17.9 % — AB (ref 11.5–15.5)
WBC: 10.1 10*3/uL (ref 4.0–10.5)

## 2013-08-02 LAB — BASIC METABOLIC PANEL
BUN: 32 mg/dL — ABNORMAL HIGH (ref 6–23)
CHLORIDE: 108 meq/L (ref 96–112)
CO2: 27 meq/L (ref 19–32)
Calcium: 7.9 mg/dL — ABNORMAL LOW (ref 8.4–10.5)
Creatinine, Ser: 0.54 mg/dL (ref 0.50–1.35)
GFR calc non Af Amer: >90 mL/min (ref >90)
Glucose, Bld: 117 mg/dL — ABNORMAL HIGH (ref 70–99)
Potassium: 3.8 mEq/L (ref 3.7–5.3)
Sodium: 146 mEq/L (ref 137–147)

## 2013-08-02 LAB — GLUCOSE, CAPILLARY: Glucose-Capillary: 103 mg/dL — ABNORMAL HIGH (ref 70–99)

## 2013-08-02 MED ORDER — FUROSEMIDE 10 MG/ML IJ SOLN
20.0000 mg | Freq: Once | INTRAMUSCULAR | Status: AC
  Administered 2013-08-02: 20 mg via INTRAVENOUS

## 2013-08-02 MED ORDER — PIVOT 1.5 CAL PO LIQD
1000.0000 mL | ORAL | Status: DC
Start: 2013-08-02 — End: 2013-08-03
  Administered 2013-08-03: 1000 mL
  Filled 2013-08-02 (×3): qty 1000

## 2013-08-02 MED ORDER — FUROSEMIDE 10 MG/ML IJ SOLN
INTRAMUSCULAR | Status: AC
  Filled 2013-08-02: qty 4

## 2013-08-02 MED ORDER — METOPROLOL TARTRATE 25 MG/10 ML ORAL SUSPENSION
25.0000 mg | Freq: Two times a day (BID) | ORAL | Status: DC
  Administered 2013-08-03 – 2013-08-14 (×24): 25 mg via ORAL
  Filled 2013-08-02 (×25): qty 10
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
## 2013-08-02 NOTE — Progress Notes (Signed)
Subjective: 4 Days Post-Op Procedure(s) (LRB): INTRAMEDULLARY (IM) NAIL INTERTROCHANTRIC (Left) Patient extubated just finishing up working with PT.  Objective: Vital signs in last 24 hours: Temp:  [99 F (37.2 C)-100 F (37.8 C)] 100 F (37.8 C) (01/18 0800) Pulse Rate:  [97-129] 112 (01/18 0800) Resp:  [16-32] 23 (01/18 0800) BP: (127-169)/(64-114) 163/80 mmHg (01/18 0800) SpO2:  [90 %-100 %] 96 % (01/18 0800) FiO2 (%):  [40 %] 40 % (01/17 1000) Weight:  [68.4 kg (150 lb 12.7 oz)] 68.4 kg (150 lb 12.7 oz) (01/18 0436)  Intake/Output from previous day: 01/17 0701 - 01/18 0700 In: 1432.2 [I.V.:1222.2; NG/GT:90; IV Piggyback:120] Out: 1885 [Urine:1885] Intake/Output this shift: Total I/O In: 50 [I.V.:50] Out: 200 [Urine:200]   Recent Labs  07/30/13 1400 07/31/13 0222 08/01/13 0400 08/02/13 0400  HGB 8.2* 7.9* 7.3* 8.0*    Recent Labs  08/01/13 0400 08/02/13 0400  WBC 8.2 10.1  RBC 2.55* 2.74*  HCT 22.1* 24.0*  PLT 94* 154    Recent Labs  08/01/13 0400 08/02/13 0400  NA 150* 146  K 3.8 3.8  CL 115* 108  CO2 26 27  BUN 31* 32*  CREATININE 0.57 0.54  GLUCOSE 136* 117*  CALCIUM 7.6* 7.9*   No results found for this basename: LABPT, INR,  in the last 72 hours  Incision: dressing C/D/I  Assessment/Plan: 4 Days Post-Op Procedure(s) (LRB): INTRAMEDULLARY (IM) NAIL INTERTROCHANTRIC (Left)  Radiographs left hip s/p IM nailing without complicating features. Non weight bearing lower extremities.   Richardean CanalCLARK, GILBERT 08/02/2013, 8:58 AM
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
## 2013-08-03 ENCOUNTER — Inpatient Hospital Stay (HOSPITAL_COMMUNITY): Payer: No Typology Code available for payment source

## 2013-08-03 DIAGNOSIS — S72143A Displaced intertrochanteric fracture of unspecified femur, initial encounter for closed fracture: Secondary | ICD-10-CM

## 2013-08-03 DIAGNOSIS — S329XXA Fracture of unspecified parts of lumbosacral spine and pelvis, initial encounter for closed fracture: Secondary | ICD-10-CM

## 2013-08-03 DIAGNOSIS — S2239XA Fracture of one rib, unspecified side, initial encounter for closed fracture: Secondary | ICD-10-CM

## 2013-08-03 DIAGNOSIS — S069XAA Unspecified intracranial injury with loss of consciousness status unknown, initial encounter: Secondary | ICD-10-CM

## 2013-08-03 DIAGNOSIS — S069X9A Unspecified intracranial injury with loss of consciousness of unspecified duration, initial encounter: Secondary | ICD-10-CM

## 2013-08-03 LAB — GLUCOSE, CAPILLARY
GLUCOSE-CAPILLARY: 116 mg/dL — AB (ref 70–99)
GLUCOSE-CAPILLARY: 122 mg/dL — AB (ref 70–99)
GLUCOSE-CAPILLARY: 139 mg/dL — AB (ref 70–99)
GLUCOSE-CAPILLARY: 143 mg/dL — AB (ref 70–99)
GLUCOSE-CAPILLARY: 145 mg/dL — AB (ref 70–99)
GLUCOSE-CAPILLARY: 152 mg/dL — AB (ref 70–99)

## 2013-08-03 LAB — CBC
HEMATOCRIT: 24.1 % — AB (ref 39.0–52.0)
HEMOGLOBIN: 8 g/dL — AB (ref 13.0–17.0)
MCH: 29.1 pg (ref 26.0–34.0)
MCHC: 33.2 g/dL (ref 30.0–36.0)
MCV: 87.6 fL (ref 78.0–100.0)
Platelets: 239 10*3/uL (ref 150–400)
RBC: 2.75 MIL/uL — AB (ref 4.22–5.81)
RDW: 17.8 % — ABNORMAL HIGH (ref 11.5–15.5)
WBC: 7.8 10*3/uL (ref 4.0–10.5)

## 2013-08-03 LAB — BASIC METABOLIC PANEL
BUN: 33 mg/dL — AB (ref 6–23)
CO2: 28 meq/L (ref 19–32)
Calcium: 7.7 mg/dL — ABNORMAL LOW (ref 8.4–10.5)
Chloride: 111 mEq/L (ref 96–112)
Creatinine, Ser: 0.56 mg/dL (ref 0.50–1.35)
GFR calc Af Amer: >90 mL/min (ref >90)
Glucose, Bld: 126 mg/dL — ABNORMAL HIGH (ref 70–99)
POTASSIUM: 3.6 meq/L — AB (ref 3.7–5.3)
SODIUM: 150 meq/L — AB (ref 137–147)

## 2013-08-03 MED ORDER — ENOXAPARIN SODIUM 40 MG/0.4ML ~~LOC~~ SOLN
40.0000 mg | Freq: Every day | SUBCUTANEOUS | Status: DC
  Administered 2013-08-03 – 2013-08-09 (×7): 40 mg via SUBCUTANEOUS
  Filled 2013-08-03 (×8): qty 0.4

## 2013-08-03 MED ORDER — FREE WATER
200.0000 mL | Freq: Three times a day (TID) | Status: DC
  Administered 2013-08-03 – 2013-08-04 (×4): 200 mL

## 2013-08-03 MED ORDER — PIVOT 1.5 CAL PO LIQD
1000.0000 mL | ORAL | Status: DC
  Administered 2013-08-05: 1000 mL
  Filled 2013-08-03 (×3): qty 1000

## 2013-08-03 NOTE — Progress Notes (Addendum)
RT performed nasal tracheal suctioning on patient removing corpious amount of thick tan/white/pink tinged secretions. Patient tolerated procedure well.

## 2013-08-03 NOTE — Progress Notes (Signed)
Subjective: 5 Days Post-Op Procedure(s) (LRB): INTRAMEDULLARY (IM) NAIL INTERTROCHANTRIC (Left) Patient reports pain as mild.   Pt confused and restrained.  Objective: Vital signs in last 24 hours: Temp:  [97.8 F (36.6 C)-100.2 F (37.9 C)] 98.1 F (36.7 C) (01/19 0837) Pulse Rate:  [102-121] 109 (01/19 1230) Resp:  [21-34] 26 (01/19 1230) BP: (128-172)/(63-102) 137/69 mmHg (01/19 1200) SpO2:  [82 %-100 %] 92 % (01/19 1230) Weight:  [68.3 kg (150 lb 9.2 oz)] 68.3 kg (150 lb 9.2 oz) (01/19 0500)  Intake/Output from previous day: 01/18 0701 - 01/19 0700 In: 1380 [I.V.:1220; NG/GT:160] Out: 2455 [Urine:2455] Intake/Output this shift: Total I/O In: 625 [I.V.:250; NG/GT:375] Out: 22 [Urine:22]   Recent Labs  08/01/13 0400 08/02/13 0400 08/03/13 0400  HGB 7.3* 8.0* 8.0*    Recent Labs  08/02/13 0400 08/03/13 0400  WBC 10.1 7.8  RBC 2.74* 2.75*  HCT 24.0* 24.1*  PLT 154 239    Recent Labs  08/02/13 0400 08/03/13 0400  NA 146 150*  K 3.8 3.6*  CL 108 111  CO2 27 28  BUN 32* 33*  CREATININE 0.54 0.56  GLUCOSE 117* 126*  CALCIUM 7.9* 7.7*   No results found for this basename: LABPT, INR,  in the last 72 hours  Neurovascular intact Incision: dressing without drainage of LE.  left UE with continued drainage at elbow  Assessment/Plan: 5 Days Post-Op Procedure(s) (LRB): INTRAMEDULLARY (IM) NAIL INTERTROCHANTRIC (Left) Up with therapy when stable  Continue daily dressing changes to the left UE, as needed to left LE  [name] [name] M 08/03/2013, 1:26 PM
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
## 2013-08-03 NOTE — Progress Notes (Signed)
Rehab Admissions Coordinator Note:  Patient was screened by Lorie Cleckley L for appropriateness for an Inpatient Acute Rehab Consult.  At this time, we are recommending Inpatient Rehab consult.  Braylei Totino, PT Rehabilitation Admissions Coordinator [phone]  

## 2013-08-03 NOTE — Progress Notes (Signed)
Pt pulled out Panda NG tube for the third time. Pt had on safety mitts on. Placed call to Trauma MD, Janee Mornhompson to make aware and request instruction on proceeding with care of plan.   MD said to replace Panda tube and apply bilateral soft wrist restraints to ensure tube remains in place. Tube is necessary for meds and nutrition, d/t pt being unable to safely swallow at this time.  Pt was debriefed about restraint application and requirements for discontinuation of restraints. Pt unable to confirm understanding of learning. Restraints applied, safety checks and care of pt to continue.  Khiana Camino GARNER

## 2013-08-03 NOTE — Clinical Social Work Note (Signed)
Clinical Social Worker continuing to follow patient and family for support and discharge planning needs.  No family currently at bedside.  Patient is extubated and alert and oriented to self only.  Per RN, patient is being NT suctioned about every 2 hours after extubation.  CSW will await PT/OT evaluations and medical stability to further explore patient needs at discharge.  Patient family has agreed to provide 24 hour care in the home once able to return home.  CSW remains available for support and to assist with discharge planning needs once medically ready.  Macario GoldsJesse Chauntay Paszkiewicz, [place] [phone]

## 2013-08-03 NOTE — Progress Notes (Addendum)
OT Cancellation Note  Patient Details Name: [name] DewJerry Hankerson MRN: [id number] DOB: 01/02/1931   Cancelled Treatment:    Reason Eval/Treat Not Completed: Fatigue/lethargy limiting ability to participate Pt sleeping. Nsg asked to hold until this pm since pt did not sleep last night. Will attempt to see later this pm if able. [facility] Center[facility],HILLARY Ahren Pettinger, OTR/L  [phone] 08/03/2013 08/03/2013, 10:25 AM
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
## 2013-08-03 NOTE — Progress Notes (Signed)
PT Cancellation Note  Patient Details Name: [name] [name] MRN: [id number] DOB: 03/07/1931   Cancelled Treatment:    Reason Eval/Treat Not Completed: Patient's level of consciousness. Pt attempted x2 t/o day pt con't to be extremely lethargic and unable to participate at this time. Pt on non-re-breather earlier this AM. PT to return when able.   [name], [name] [name] [name] [name] 08/03/2013, 4:45 PM

## 2013-08-03 NOTE — Progress Notes (Signed)
SLP Cancellation Note  Patient Details Name: [name] [name] MRN: [id number] DOB: 02/01/1931   Cancelled treatment:       Reason Eval/Treat Not Completed: Medical issues which prohibited therapy;Patient not medically ready;Patient's level of consciousness. Additionally, patient on non-rebreather mask, requiring NT suctioning per RN. Will f/u 1/20.   [name] LangoLeah Angelina Neece MA, CCC-SLP [phone]    [name] [name] 08/03/2013, 3:23 PM

## 2013-08-03 NOTE — Progress Notes (Signed)
I will follow pt's progress to assist with determining most appropriate rehab venue. [phone]

## 2013-08-04 ENCOUNTER — Encounter (HOSPITAL_COMMUNITY): Payer: Self-pay | Admitting: *Deleted

## 2013-08-04 DIAGNOSIS — E870 Hyperosmolality and hypernatremia: Secondary | ICD-10-CM

## 2013-08-04 DIAGNOSIS — N179 Acute kidney failure, unspecified: Secondary | ICD-10-CM

## 2013-08-04 DIAGNOSIS — E876 Hypokalemia: Secondary | ICD-10-CM

## 2013-08-04 LAB — GLUCOSE, CAPILLARY
GLUCOSE-CAPILLARY: 145 mg/dL — AB (ref 70–99)
Glucose-Capillary: 108 mg/dL — ABNORMAL HIGH (ref 70–99)
Glucose-Capillary: 130 mg/dL — ABNORMAL HIGH (ref 70–99)
Glucose-Capillary: 143 mg/dL — ABNORMAL HIGH (ref 70–99)

## 2013-08-04 LAB — CBC WITH DIFFERENTIAL/PLATELET
BASOS PCT: 0 % (ref 0–1)
Basophils Absolute: 0 10*3/uL (ref 0.0–0.1)
Eosinophils Absolute: 0.2 10*3/uL (ref 0.0–0.7)
Eosinophils Relative: 3 % (ref 0–5)
HEMATOCRIT: 23.8 % — AB (ref 39.0–52.0)
Hemoglobin: 7.7 g/dL — ABNORMAL LOW (ref 13.0–17.0)
LYMPHS PCT: 13 % (ref 12–46)
Lymphs Abs: 1 10*3/uL (ref 0.7–4.0)
MCH: 29.3 pg (ref 26.0–34.0)
MCHC: 32.4 g/dL (ref 30.0–36.0)
MCV: 90.5 fL (ref 78.0–100.0)
Monocytes Absolute: 1.1 10*3/uL — ABNORMAL HIGH (ref 0.1–1.0)
Monocytes Relative: 15 % — ABNORMAL HIGH (ref 3–12)
NEUTROS PCT: 69 % (ref 43–77)
Neutro Abs: 5.4 10*3/uL (ref 1.7–7.7)
PLATELETS: 299 10*3/uL (ref 150–400)
RBC: 2.63 MIL/uL — ABNORMAL LOW (ref 4.22–5.81)
RDW: 17.9 % — AB (ref 11.5–15.5)
WBC: 7.8 10*3/uL (ref 4.0–10.5)

## 2013-08-04 LAB — BASIC METABOLIC PANEL
BUN: 37 mg/dL — ABNORMAL HIGH (ref 6–23)
CHLORIDE: 117 meq/L — AB (ref 96–112)
CO2: 29 meq/L (ref 19–32)
CREATININE: 0.55 mg/dL (ref 0.50–1.35)
Calcium: 7.6 mg/dL — ABNORMAL LOW (ref 8.4–10.5)
GFR calc Af Amer: >90 mL/min (ref >90)
GFR calc non Af Amer: >90 mL/min (ref >90)
Glucose, Bld: 144 mg/dL — ABNORMAL HIGH (ref 70–99)
POTASSIUM: 3.6 meq/L — AB (ref 3.7–5.3)
SODIUM: 154 meq/L — AB (ref 137–147)

## 2013-08-04 MED ORDER — POTASSIUM CHLORIDE 20 MEQ/15ML (10%) PO LIQD
40.0000 meq | Freq: Two times a day (BID) | ORAL | Status: DC
  Administered 2013-08-04 – 2013-08-06 (×6): 40 meq
  Administered 2013-08-07: 10 meq
  Administered 2013-08-07 – 2013-08-10 (×7): 40 meq
  Filled 2013-08-04 (×16): qty 30

## 2013-08-04 MED ORDER — FENTANYL CITRATE 0.05 MG/ML IJ SOLN
25.0000 ug | INTRAMUSCULAR | Status: DC | PRN
  Administered 2013-08-04 – 2013-08-17 (×4): 25 ug via INTRAVENOUS
  Filled 2013-08-04 (×4): qty 2

## 2013-08-04 MED ORDER — HYDROCODONE-ACETAMINOPHEN 7.5-325 MG/15ML PO SOLN
5.0000 mL | ORAL | Status: DC | PRN
  Administered 2013-08-13: 10 mL via ORAL
  Filled 2013-08-04: qty 15

## 2013-08-04 MED ORDER — FREE WATER
200.0000 mL | Freq: Four times a day (QID) | Status: DC
  Administered 2013-08-04 – 2013-08-07 (×11): 200 mL

## 2013-08-04 MED ORDER — GUAIFENESIN 100 MG/5ML PO SYRP
400.0000 mg | ORAL_SOLUTION | ORAL | Status: DC
  Administered 2013-08-04 – 2013-08-22 (×103): 400 mg
  Filled 2013-08-04 (×121): qty 20

## 2013-08-04 MED ORDER — PNEUMOCOCCAL VAC POLYVALENT 25 MCG/0.5ML IJ INJ
0.5000 mL | INJECTION | INTRAMUSCULAR | Status: AC
  Administered 2013-08-05: 0.5 mL via INTRAMUSCULAR
  Filled 2013-08-04: qty 0.5
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
## 2013-08-04 NOTE — Progress Notes (Signed)
RT nasal tracheal suctioned patient and obtained copious amounts of frothy white secretions. Patient tolerated procedure well. Vitals stable throughout.

## 2013-08-04 NOTE — Progress Notes (Signed)
RT coached patient to cough. RT used oral suctioning when patient coughed inorder to remove copious pink tinged/tan thick secretions orally. Patient tolerated procedure well.
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
## 2013-08-04 NOTE — Progress Notes (Signed)
RT performed nasal tracheal suction and removed copious thick tan/pink tinged secretions. Patient tolerated procedure well. RT placed patient on nonrebreather per drop in sat.

## 2013-08-04 NOTE — Progress Notes (Signed)
Working on pulmonary toilet. Get up to chair with lift. Needing restraints due to repetitive removal of panda and condom cath. Patient examined and I agree with the assessment and plan  Violeta GelinasBurke Barrett Goldie, MD, MPH, FACS Pager: [phone]  08/04/2013 9:12 AM Patient examined and I agree with the assessment and plan  Violeta GelinasBurke Tacuma Graffam, MD, MPH, FACS Pager: [phone]  08/04/2013 9:11 AM

## 2013-08-05 DIAGNOSIS — S2249XA Multiple fractures of ribs, unspecified side, initial encounter for closed fracture: Secondary | ICD-10-CM

## 2013-08-05 DIAGNOSIS — S36113A Laceration of liver, unspecified degree, initial encounter: Secondary | ICD-10-CM

## 2013-08-05 LAB — BASIC METABOLIC PANEL
BUN: 33 mg/dL — ABNORMAL HIGH (ref 6–23)
CO2: 27 mEq/L (ref 19–32)
Calcium: 8 mg/dL — ABNORMAL LOW (ref 8.4–10.5)
Chloride: 116 mEq/L — ABNORMAL HIGH (ref 96–112)
Creatinine, Ser: 0.55 mg/dL (ref 0.50–1.35)
Glucose, Bld: 132 mg/dL — ABNORMAL HIGH (ref 70–99)
POTASSIUM: 4.1 meq/L (ref 3.7–5.3)
SODIUM: 153 meq/L — AB (ref 137–147)

## 2013-08-05 LAB — GLUCOSE, CAPILLARY
GLUCOSE-CAPILLARY: 116 mg/dL — AB (ref 70–99)
GLUCOSE-CAPILLARY: 127 mg/dL — AB (ref 70–99)

## 2013-08-05 MED ORDER — PIVOT 1.5 CAL PO LIQD
1000.0000 mL | ORAL | Status: AC
  Administered 2013-08-05 – 2013-08-09 (×7): 1000 mL
  Filled 2013-08-05 (×5): qty 1000

## 2013-08-05 MED ORDER — BISACODYL 10 MG RE SUPP
10.0000 mg | Freq: Once | RECTAL | Status: AC
  Administered 2013-08-05: 10 mg via RECTAL
  Filled 2013-08-05: qty 1

## 2013-08-05 MED ORDER — PANTOPRAZOLE SODIUM 40 MG PO PACK
40.0000 mg | PACK | Freq: Every day | ORAL | Status: DC
  Administered 2013-08-05 – 2013-08-22 (×18): 40 mg
  Filled 2013-08-05 (×19): qty 20
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
## 2013-08-05 NOTE — Progress Notes (Signed)
Met briefly with pt at bedside.Notably disoriented. I have left message for his son, Zeke, to contact me to discuss pt's potential rehab venue options. Concern with NWB for three extremities as well as TBI diagnosis for prolonged rehab recovery. I will follow his progress. [phone]
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
## 2013-08-06 ENCOUNTER — Inpatient Hospital Stay (HOSPITAL_COMMUNITY): Payer: No Typology Code available for payment source

## 2013-08-06 DIAGNOSIS — J15212 Pneumonia due to Methicillin resistant Staphylococcus aureus: Secondary | ICD-10-CM

## 2013-08-06 LAB — CBC WITH DIFFERENTIAL/PLATELET
BASOS ABS: 0 10*3/uL (ref 0.0–0.1)
Basophils Relative: 0 % (ref 0–1)
Eosinophils Absolute: 0.1 10*3/uL (ref 0.0–0.7)
Eosinophils Relative: 1 % (ref 0–5)
HEMATOCRIT: 28.1 % — AB (ref 39.0–52.0)
Hemoglobin: 8.8 g/dL — ABNORMAL LOW (ref 13.0–17.0)
Lymphocytes Relative: 5 % — ABNORMAL LOW (ref 12–46)
Lymphs Abs: 0.8 10*3/uL (ref 0.7–4.0)
MCH: 29 pg (ref 26.0–34.0)
MCHC: 31.3 g/dL (ref 30.0–36.0)
MCV: 92.7 fL (ref 78.0–100.0)
MONO ABS: 0.8 10*3/uL (ref 0.1–1.0)
Monocytes Relative: 5 % (ref 3–12)
NEUTROS ABS: 13.8 10*3/uL — AB (ref 1.7–7.7)
Neutrophils Relative %: 89 % — ABNORMAL HIGH (ref 43–77)
PLATELETS: 463 10*3/uL — AB (ref 150–400)
RBC: 3.03 MIL/uL — ABNORMAL LOW (ref 4.22–5.81)
RDW: 18.4 % — AB (ref 11.5–15.5)
WBC: 15.5 10*3/uL — AB (ref 4.0–10.5)

## 2013-08-06 LAB — BASIC METABOLIC PANEL
BUN: 40 mg/dL — ABNORMAL HIGH (ref 6–23)
CHLORIDE: 113 meq/L — AB (ref 96–112)
CO2: 28 mEq/L (ref 19–32)
Calcium: 7.8 mg/dL — ABNORMAL LOW (ref 8.4–10.5)
Creatinine, Ser: 0.64 mg/dL (ref 0.50–1.35)
GFR calc Af Amer: >90 mL/min (ref >90)
GFR calc non Af Amer: >90 mL/min (ref >90)
GLUCOSE: 125 mg/dL — AB (ref 70–99)
Potassium: 4.7 mEq/L (ref 3.7–5.3)
SODIUM: 149 meq/L — AB (ref 137–147)

## 2013-08-06 LAB — POCT I-STAT 3, ART BLOOD GAS (G3+)
ACID-BASE EXCESS: 4 mmol/L — AB (ref 0.0–2.0)
Acid-Base Excess: 3 mmol/L — ABNORMAL HIGH (ref 0.0–2.0)
BICARBONATE: 28.3 meq/L — AB (ref 20.0–24.0)
Bicarbonate: 31.3 meq/L — ABNORMAL HIGH (ref 20.0–24.0)
O2 SAT: 92 %
O2 SAT: 99 %
PH ART: 7.419 (ref 7.350–7.450)
PO2 ART: 161 mmHg — AB (ref 80.0–100.0)
Patient temperature: 98.6
TCO2: 33 mmol/L (ref 0–100)
TCO2: 30 mmol/L (ref 0–100)
pCO2 arterial: 63.6 mmHg (ref 35.0–45.0)
pCO2 arterial: 43.8 mmHg (ref 35.0–45.0)
pH, Arterial: 7.299 — ABNORMAL LOW (ref 7.350–7.450)
pO2, Arterial: 72 mmHg — ABNORMAL LOW (ref 80.0–100.0)

## 2013-08-06 LAB — CULTURE, RESPIRATORY W GRAM STAIN

## 2013-08-06 LAB — CULTURE, RESPIRATORY

## 2013-08-06 MED ORDER — MIDAZOLAM HCL 2 MG/2ML IJ SOLN
INTRAMUSCULAR | Status: AC
  Administered 2013-08-06: 2 mg
  Filled 2013-08-06: qty 4

## 2013-08-06 MED ORDER — FENTANYL CITRATE 0.05 MG/ML IJ SOLN
INTRAMUSCULAR | Status: AC
  Filled 2013-08-06: qty 4

## 2013-08-06 MED ORDER — VANCOMYCIN HCL IN DEXTROSE 1-5 GM/200ML-% IV SOLN
1000.0000 mg | Freq: Two times a day (BID) | INTRAVENOUS | Status: AC
  Administered 2013-08-06 – 2013-08-15 (×19): 1000 mg via INTRAVENOUS
  Filled 2013-08-06 (×20): qty 200

## 2013-08-06 MED ORDER — VECURONIUM BROMIDE 10 MG IV SOLR
INTRAVENOUS | Status: AC
  Filled 2013-08-06: qty 10

## 2013-08-06 MED ORDER — SODIUM CHLORIDE 0.9 % IV SOLN: 500.0000 mL | Freq: Once | INTRAVENOUS | Status: DC

## 2013-08-06 MED ORDER — ALBUMIN HUMAN 5 % IV SOLN
12.5000 g | Freq: Once | INTRAVENOUS | Status: AC
  Administered 2013-08-06: 12.5 g via INTRAVENOUS
  Filled 2013-08-06: qty 250

## 2013-08-06 MED ORDER — FENTANYL CITRATE 0.05 MG/ML IJ SOLN
100.0000 ug | Freq: Once | INTRAMUSCULAR | Status: AC
  Administered 2013-08-06: 100 ug via INTRAVENOUS

## 2013-08-06 MED ORDER — MIDAZOLAM HCL 2 MG/2ML IJ SOLN
5.0000 mg | Freq: Once | INTRAMUSCULAR | Status: AC
  Administered 2013-08-06: 1 mg via INTRAVENOUS
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
## 2013-08-06 NOTE — Progress Notes (Signed)
Patient's secretions have gotten progressively worse through out the night. RT placed nasal trumpet in right nare without any complications. RT performed NTS procedure and removed a copious amount of thick tan/yellow/white thick/thin secretions. Patient tolerated procedure well.

## 2013-08-06 NOTE — Progress Notes (Signed)
Patient decompensated throughout the day, and possibly aspirated when he pulled his NGT back some.  I discussed this with his sone, Chanetta MarshallJimmy, who understands that he needed to be reintubated.  ABG 7.30/64/72/31.3 on 100% NRB mask.  CXR is pending.  Patient reintubated with #3 MAC blade and 7.5 ETT.  CXR pending.  Excellent CO2 return with ETCO2 monitor.  I did visualize the ETT going through the cords.  Excellent breath sounds bilaterally.  Marta LamasJames O. Gae BonWyatt, III, MD, FACS [phone] Trauma Surgeon

## 2013-08-06 NOTE — Progress Notes (Signed)
At 1800 I came into pts room to find patient's Panda feeding tube halfway out of nare with pts respiratory rate in the upper 30s. Pt's O2 saturation began decreasing to 70% on 6L [place] with labored respirations; pt placed on 15L 100% NRB and pts O2 sats returned to 90s. Pt was NT suctioned with copious amount of white/tan secretions. Dr Lindie SpruceWyatt notified.  ABG ordered. Son Chanetta MarshallJimmy at bedside and states that patient would want all interventions available. Decision made by Dr. Lindie SpruceWyatt to reintubate patient. Pt given 100 mg Fentanyl and 5 mg Versed for intubation procedure. Pt placed on 100% FiO2 on the ventilator with sats in the 90s. BP 60/37; 500 cc NS bolus and 12.5 g Albumin ordered.OG placed and stat chest x-ray ordered. Patient's son, Chanetta MarshallJimmy, updated. Will continue to monitor pt.    Cyndie ChimeShelton, Monice Lundy RamosNicole
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
## 2013-08-06 NOTE — Progress Notes (Addendum)
I met with pt's son, Heath, at bedside and then pt's second ex wife, Docia Barrier, contacted me by phone per son's request. Leona's family nor Claus's family can provide 24/7 physical care of pt nor do they have wheelchair accessible homes. Patient currently not at a level to tolerate intense inpt rehab therapy. I recommend SNF rehab when pt medically ready and have discussed with Taiga, son. I will contact RN CM and SW.  Please call me with any questions.[phone]

## 2013-08-07 ENCOUNTER — Inpatient Hospital Stay (HOSPITAL_COMMUNITY): Payer: No Typology Code available for payment source

## 2013-08-07 DIAGNOSIS — D62 Acute posthemorrhagic anemia: Secondary | ICD-10-CM

## 2013-08-07 LAB — BASIC METABOLIC PANEL
BUN: 47 mg/dL — AB (ref 6–23)
CALCIUM: 7.8 mg/dL — AB (ref 8.4–10.5)
CO2: 27 mEq/L (ref 19–32)
CREATININE: 0.75 mg/dL (ref 0.50–1.35)
Chloride: 115 mEq/L — ABNORMAL HIGH (ref 96–112)
GFR, EST NON AFRICAN AMERICAN: 84 mL/min — AB (ref >90)
GLUCOSE: 146 mg/dL — AB (ref 70–99)
Potassium: 4.9 mEq/L (ref 3.7–5.3)
Sodium: 149 mEq/L — ABNORMAL HIGH (ref 137–147)

## 2013-08-07 LAB — CBC WITH DIFFERENTIAL/PLATELET
BASOS ABS: 0 10*3/uL (ref 0.0–0.1)
Basophils Relative: 0 % (ref 0–1)
EOS ABS: 0 10*3/uL (ref 0.0–0.7)
Eosinophils Relative: 0 % (ref 0–5)
HCT: 25.2 % — ABNORMAL LOW (ref 39.0–52.0)
Hemoglobin: 8.1 g/dL — ABNORMAL LOW (ref 13.0–17.0)
LYMPHS ABS: 1.3 10*3/uL (ref 0.7–4.0)
Lymphocytes Relative: 7 % — ABNORMAL LOW (ref 12–46)
MCH: 29.6 pg (ref 26.0–34.0)
MCHC: 32.1 g/dL (ref 30.0–36.0)
MCV: 92 fL (ref 78.0–100.0)
MONO ABS: 1.1 10*3/uL — AB (ref 0.1–1.0)
Monocytes Relative: 6 % (ref 3–12)
NEUTROS ABS: 15.6 10*3/uL — AB (ref 1.7–7.7)
Neutrophils Relative %: 87 % — ABNORMAL HIGH (ref 43–77)
Platelets: 402 10*3/uL — ABNORMAL HIGH (ref 150–400)
RBC: 2.74 MIL/uL — ABNORMAL LOW (ref 4.22–5.81)
RDW: 18.4 % — AB (ref 11.5–15.5)
WBC: 18 10*3/uL — AB (ref 4.0–10.5)

## 2013-08-07 LAB — BLOOD GAS, ARTERIAL
ACID-BASE EXCESS: 3.9 mmol/L — AB (ref 0.0–2.0)
Bicarbonate: 27.4 mEq/L — ABNORMAL HIGH (ref 20.0–24.0)
Drawn by: 36274
FIO2: 40 %
MECHVT: 500 mL
O2 Saturation: 96.3 %
PEEP: 5 cmH2O
PH ART: 7.474 — AB (ref 7.350–7.450)
PO2 ART: 73.3 mmHg — AB (ref 80.0–100.0)
Patient temperature: 98.6
RATE: 18 resp/min
TCO2: 28.5 mmol/L (ref 0–100)
pCO2 arterial: 37.7 mmHg (ref 35.0–45.0)

## 2013-08-07 MED ORDER — FENTANYL CITRATE 0.05 MG/ML IJ SOLN
50.0000 ug/h | INTRAMUSCULAR | Status: DC
  Administered 2013-08-07: 50 ug/h via INTRAVENOUS
  Administered 2013-08-08 – 2013-08-10 (×2): 60 ug/h via INTRAVENOUS
  Administered 2013-08-10: 50 ug/h via INTRAVENOUS
  Administered 2013-08-12 – 2013-08-13 (×2): 60 ug/h via INTRAVENOUS
  Filled 2013-08-07 (×5): qty 50

## 2013-08-07 MED ORDER — FREE WATER
200.0000 mL | Status: DC
  Administered 2013-08-07 – 2013-08-11 (×21): 200 mL
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
## 2013-08-07 NOTE — Progress Notes (Signed)
PT Cancellation Note  Patient Details Name: [name] [name] MRN: [id number] DOB: 02/17/1931   Cancelled Treatment:    Reason Eval/Treat Not Completed: Patient's level of consciousness;Medical issues which prohibited therapy.  Pt recently intubated and is sedated at this time. Will re-attempt to see pt when appropriate.    Donnamarie [place], Valon Glasscock [place], [place] CarolinaPT [phone] 08/07/2013, 10:29 AM

## 2013-08-07 NOTE — Progress Notes (Signed)
Subjective: 9 Days Post-Op Procedure(s) (LRB): INTRAMEDULLARY (IM) NAIL INTERTROCHANTRIC (Left) Patient reports pain as unknown, intubated.  .    Objective: Vital signs in last 24 hours: Temp:  [98.1 F (36.7 C)-99.8 F (37.7 C)] 99.1 F (37.3 C) (01/23 0800) Pulse Rate:  [98-125] 110 (01/23 1100) Resp:  [18-40] 28 (01/23 1100) BP: (60-173)/(37-87) 115/54 mmHg (01/23 1100) SpO2:  [89 %-100 %] 98 % (01/23 1100) FiO2 (%):  [40 %-100 %] 40 % (01/23 1030) Weight:  [68.3 kg (150 lb 9.2 oz)] 68.3 kg (150 lb 9.2 oz) (01/23 0500)  Intake/Output from previous day: 01/22 0701 - 01/23 0700 In: 2472.4 [I.V.:32.4; NG/GT:1790; IV Piggyback:650] Out: 40 [Urine:40] Intake/Output this shift: Total I/O In: 482.1 [I.V.:2.1; NG/GT:480] Out: -    Recent Labs  08/06/13 2000 08/07/13 0450  HGB 8.8* 8.1*    Recent Labs  08/06/13 2000 08/07/13 0450  WBC 15.5* 18.0*  RBC 3.03* 2.74*  HCT 28.1* 25.2*  PLT 463* 402*    Recent Labs  08/06/13 2000 08/07/13 0450  NA 149* 149*  K 4.7 4.9  CL 113* 115*  CO2 28 27  BUN 40* 47*  CREATININE 0.64 0.75  GLUCOSE 125* 146*  CALCIUM 7.8* 7.8*   No results found for this basename: LABPT, INR,  in the last 72 hours  intubated and sedated.  MRSA per sputum on Vanc.  WBC  18K.   left hip incisions clear serosang drainage.  no purulence , incisions look good.   Assessment/Plan: 9 Days Post-Op Procedure(s) (LRB): INTRAMEDULLARY (IM) NAIL INTERTROCHANTRIC (Left) Plan:   Will change from Mepilex to 4X4 and ABD so RN does not have to change so often. Will continue to follow.   xrays Hip 1/16 looked good. He has been intubated since and will wait until next week to check new hip xrays. Likely hypoalbuminemia,He was only 2.9 albumin on admission.  no recent CMET. Peg and nutrition likely soon.      [name] [name] C 08/07/2013, 12:17 PM

## 2013-08-07 NOTE — Progress Notes (Signed)
Patient ID: [name] [name], male   DOB: [birth year], 80 y.o.   MRN: [id number] I met with the patient's son and 3 of his daughters along with the trauma social worker and the patient's nurse today. We discussed his clinical course and also discussed proceeding with tracheostomy and percutaneous endoscopic gastrostomy tube placement this coming Monday. We discussed the procedures and the risks and benefits. They have decided as a group to go forward with this in the hopes of assisting the patient's recovery. They have agreed and we will proceed Monday. I answered their questions.  Georganna Skeans, MD, MPH, FACS Pager: [phone]
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
## 2013-08-07 NOTE — Progress Notes (Signed)
SLP Cancellation Note  Patient Details Name: [name] [name] MRN: [id number] DOB: 04/22/1931   Cancelled treatment:        Pt. reintubated (possible aspiration of tube feeds?).  Will follow along for reassessment of swallow.  If it appears pt. Will be intubated longer than several days, will d/c.   Breck CoonsLisa Willis Floyd HillLitaker M.Ed [employer] IndustriesCCC-SLP Pager [phone]  08/07/2013

## 2013-08-07 NOTE — Clinical Social Work Note (Signed)
Clinical Social Worker continuing to follow patient and family for support and discharge planning needs.  CSW, RN, and MD met with patient 4 children Butch Penny, Benjamine Mola, [place], and [place]) - other 2 children are unavailable to meet at this time but have been informed of conversation and are in agreement.  Patient remains intubated at this time.  Patient family has opted for trach and PEG placement which will likely be on Monday at bedside.  Patient family seems to be getting along well at this time and seems to all be in agreement with current plans.  CSW spoke briefly about patient plans for SNF at discharge - patient family verbalized their understanding.  CSW remains available for support and to facilitate patient discharge needs once medically ready.  Barbette Or, [place]

## 2013-08-08 ENCOUNTER — Inpatient Hospital Stay (HOSPITAL_COMMUNITY): Payer: No Typology Code available for payment source

## 2013-08-08 LAB — BASIC METABOLIC PANEL
BUN: 44 mg/dL — ABNORMAL HIGH (ref 6–23)
CHLORIDE: 113 meq/L — AB (ref 96–112)
CO2: 28 meq/L (ref 19–32)
Calcium: 7.7 mg/dL — ABNORMAL LOW (ref 8.4–10.5)
Creatinine, Ser: 0.67 mg/dL (ref 0.50–1.35)
GFR calc Af Amer: >90 mL/min (ref >90)
GFR calc non Af Amer: 88 mL/min — ABNORMAL LOW (ref >90)
GLUCOSE: 118 mg/dL — AB (ref 70–99)
POTASSIUM: 4.6 meq/L (ref 3.7–5.3)
Sodium: 148 mEq/L — ABNORMAL HIGH (ref 137–147)

## 2013-08-08 LAB — VANCOMYCIN, TROUGH: VANCOMYCIN TR: 15.2 ug/mL (ref 10.0–20.0)

## 2013-08-08 NOTE — Progress Notes (Signed)
Patient ID: [name] [name], male   DOB: 08/10/1930, 80 y.o.   MRN: [id number] Dr Cleophas DunkerWhitfield has spoken with family.  Patient status unchanged. Wounds clean.  Anticipate new films of left hip next week  Oris DroneBrian D. Aleda Granaetrarca, PA-C [facility] [phone]  08/08/2013 12:14 PM
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
## 2013-08-08 NOTE — Progress Notes (Signed)
ANTIBIOTIC CONSULT NOTE - FOLLOW UP  Pharmacy Consult for Vancomycin Indication: pneumonia (MRSA)  No Known Allergies  Patient Measurements: Height: 5\' 6"  (167.6 cm) Weight: 148 lb 2.4 oz (67.2 kg) IBW/kg (Calculated) : 63.8  Vital Signs: Temp: 97.3 F (36.3 C) (01/24 1152) Temp src: Axillary (01/24 1152) BP: 111/54 mmHg (01/24 1223) Pulse Rate: 89 (01/24 1223) Intake/Output from previous day: 01/23 0701 - 01/24 0700 In: 2612.1 [I.V.:102.1; NG/GT:2110; IV Piggyback:400] Out: -  Intake/Output from this shift:    Labs:  Recent Labs  08/06/13 2000 08/07/13 0450 08/08/13 0500  WBC 15.5* 18.0*  --   HGB 8.8* 8.1*  --   PLT 463* 402*  --   CREATININE 0.64 0.75 0.67   Estimated Creatinine Clearance: 66.5 ml/min (by C-G formula based on Cr of 0.67).  Recent Labs  08/08/13 1032  VANCOTROUGH 15.2     Microbiology: Recent Results (from the past 720 hour(s))  MRSA PCR SCREENING     Status: None   Collection Time    07/28/13 12:10 PM      Result Value Range Status   MRSA by PCR NEGATIVE  NEGATIVE Final   Comment:            The GeneXpert MRSA Assay (FDA     approved for NASAL specimens     only), is one component of a     comprehensive MRSA colonization     surveillance program. It is not     intended to diagnose MRSA     infection nor to guide or     monitor treatment for     MRSA infections.  CULTURE, RESPIRATORY (NON-EXPECTORATED)     Status: None   Collection Time    08/03/13  8:50 AM      Result Value Range Status   Specimen Description TRACHEAL ASPIRATE   Final   Special Requests Immunocompromised   Final   Gram Stain     Final   Value: ABUNDANT WBC PRESENT, PREDOMINANTLY PMN     RARE SQUAMOUS EPITHELIAL CELLS PRESENT     FEW GRAM NEGATIVE RODS     FEW GRAM NEGATIVE COCCI     RARE GRAM POSITIVE COCCI   Culture     Final   Value: ABUNDANT METHICILLIN RESISTANT STAPHYLOCOCCUS AUREUS     Note: RIFAMPIN AND GENTAMICIN SHOULD NOT BE USED AS SINGLE DRUGS  FOR TREATMENT OF STAPH INFECTIONS. CRITICAL RESULT CALLED TO, READ BACK BY AND VERIFIED WITH: MEGAN S@7 :45AM ON 08/06/13 BY DANTS     Performed at [facility]   Report Status 08/06/2013 FINAL   Final   Organism ID, Bacteria METHICILLIN RESISTANT STAPHYLOCOCCUS AUREUS   Final    Anti-infectives   Start     Dose/Rate Route Frequency Ordered Stop   08/06/13 1100  vancomycin (VANCOCIN) IVPB 1000 mg/200 mL premix     1,000 mg 200 mL/hr over 60 Minutes Intravenous Every 12 hours 08/06/13 1032     07/29/13 1300  ceFAZolin (ANCEF) IVPB 2 g/50 mL premix  Status:  Discontinued     2 g 100 mL/hr over 30 Minutes Intravenous  Once 07/29/13 1249 07/29/13 1450   07/28/13 1400  ceFAZolin (ANCEF) IVPB 2 g/50 mL premix     2 g 100 mL/hr over 30 Minutes Intravenous  Once 07/28/13 1356 07/28/13 1810   07/27/13 1617  ceFAZolin (ANCEF) 2 g in dextrose 5 % 50 mL IVPB     2 g 140 mL/hr over 30 Minutes Intravenous  Once 07/27/13 1618 07/27/13 1856      Assessment: 82 YOM on Vancomycin d3 for MRSA pneumonia. Vancomycin trough is 15.2 on 1g IV q12h. SCr with slight trend up yesterday is now back to 0.67 with estimated CrCl~ 65-6470mL/min. WBC was up to 18 on 1/23 but there is no CBC yet today. Patient is afebrile and has good UOP. CXR today shows slight improvement of infiltrate at left base.   Goal of Therapy:  Vancomycin trough level 15-20 mcg/ml  Plan:  1. Continue Vancomycin 1g IV q12h. 2. Monitor renal function and adjust therapy as needed.  3. Consider weekly vancomycin trough while on therapy to r/o accumulation.   Link SnufferJessica Meshia Rau, PharmD, BCPS Clinical Pharmacist [phone] 08/08/2013,12:29 PM

## 2013-08-09 ENCOUNTER — Inpatient Hospital Stay (HOSPITAL_COMMUNITY): Payer: No Typology Code available for payment source | Admitting: Anesthesiology

## 2013-08-09 ENCOUNTER — Encounter (HOSPITAL_COMMUNITY): Payer: No Typology Code available for payment source | Admitting: Anesthesiology

## 2013-08-09 ENCOUNTER — Inpatient Hospital Stay (HOSPITAL_COMMUNITY): Payer: No Typology Code available for payment source

## 2013-08-09 LAB — CBC
HEMATOCRIT: 23.5 % — AB (ref 39.0–52.0)
Hemoglobin: 7.4 g/dL — ABNORMAL LOW (ref 13.0–17.0)
MCH: 29.5 pg (ref 26.0–34.0)
MCHC: 31.5 g/dL (ref 30.0–36.0)
MCV: 93.6 fL (ref 78.0–100.0)
Platelets: 402 10*3/uL — ABNORMAL HIGH (ref 150–400)
RBC: 2.51 MIL/uL — AB (ref 4.22–5.81)
RDW: 18.3 % — ABNORMAL HIGH (ref 11.5–15.5)
WBC: 9.9 10*3/uL (ref 4.0–10.5)

## 2013-08-09 LAB — BASIC METABOLIC PANEL
BUN: 39 mg/dL — AB (ref 6–23)
CHLORIDE: 111 meq/L (ref 96–112)
CO2: 27 meq/L (ref 19–32)
Calcium: 7.4 mg/dL — ABNORMAL LOW (ref 8.4–10.5)
Creatinine, Ser: 0.62 mg/dL (ref 0.50–1.35)
GFR calc non Af Amer: >90 mL/min (ref >90)
GLUCOSE: 89 mg/dL (ref 70–99)
POTASSIUM: 4.8 meq/L (ref 3.7–5.3)
Sodium: 144 mEq/L (ref 137–147)

## 2013-08-09 MED ORDER — PROPOFOL 10 MG/ML IV EMUL
INTRAVENOUS | Status: AC
  Filled 2013-08-09: qty 100

## 2013-08-09 MED ORDER — PROPOFOL 10 MG/ML IV BOLUS
INTRAVENOUS | Status: DC | PRN
  Administered 2013-08-09: 120 mg via INTRAVENOUS

## 2013-08-09 MED ORDER — SUCCINYLCHOLINE CHLORIDE 20 MG/ML IJ SOLN
INTRAMUSCULAR | Status: DC | PRN
  Administered 2013-08-09: 120 mg via INTRAVENOUS

## 2013-08-09 NOTE — Progress Notes (Signed)
Upon assessment of pt at 0200, noticed OG tube curled in pt's mouth. Tube feeding was stopped and placement was verified. Tube was in correct place. The OG tube was removed and a new OG tube insertion was attempted. My self and Henreitta Leberhomas, Lilly, RN attempted the insertion and was not able to place the tube. Upon attempt, the pt's already leaking ETT cuff began leaking more and a red tinged secretion was evacuated from the subglottic suction. All further attempts of OG insertion was halted. Pt's vitals were stable and was in no apparent distress. RT was immediately called and responded. Dr Magnus IvanBlackman was contacted and the decision for anesthesia to replace the tube was made. Anesthesia was called and on their way. RT and RN will continue to monitor and stay with pt.
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
## 2013-08-09 NOTE — Progress Notes (Signed)
OG placed after ETT exchange. OG inserted with no issues. CXR and Abd KUB ordered. Will continue to monitor

## 2013-08-09 NOTE — Progress Notes (Signed)
Rt called several times to pts room to address 'slight' cuff leak that has been present since yesterday.  Rt came in to find pt rattling loudly.  Rt noticed subglottic not connected.  RT and RN reconnected subglottic and copious amts of pink tinged secretions were pulled from above cuff.  Rt called back to address decreasing volumes after RN attempted insertion of NG tube and rt found cuff to be blown completely.  Rt awaiting MD to tube exchange at this time.

## 2013-08-09 NOTE — Progress Notes (Signed)
Pt had tube exchange with no event.  Placed 7.5 tube at 24 @ lip. EZcap used to verify placement and cxr to follow.

## 2013-08-10 ENCOUNTER — Ambulatory Visit (HOSPITAL_COMMUNITY): Admit: 2013-08-10 | Payer: Self-pay | Admitting: General Surgery

## 2013-08-10 ENCOUNTER — Encounter (HOSPITAL_COMMUNITY): Admission: EM | Disposition: A | Discharge status: Skilled Nursing Facility | Payer: Self-pay | Source: Home / Self Care

## 2013-08-10 ENCOUNTER — Inpatient Hospital Stay (HOSPITAL_COMMUNITY): Payer: No Typology Code available for payment source

## 2013-08-10 HISTORY — PX: PEG PLACEMENT: SHX5437

## 2013-08-10 LAB — BASIC METABOLIC PANEL
BUN: 37 mg/dL — AB (ref 6–23)
CALCIUM: 7.6 mg/dL — AB (ref 8.4–10.5)
CO2: 25 mEq/L (ref 19–32)
Chloride: 107 mEq/L (ref 96–112)
Creatinine, Ser: 0.6 mg/dL (ref 0.50–1.35)
Glucose, Bld: 91 mg/dL (ref 70–99)
POTASSIUM: 5 meq/L (ref 3.7–5.3)
Sodium: 142 mEq/L (ref 137–147)

## 2013-08-10 LAB — CBC
HEMATOCRIT: 23.6 % — AB (ref 39.0–52.0)
Hemoglobin: 7.3 g/dL — ABNORMAL LOW (ref 13.0–17.0)
MCH: 29 pg (ref 26.0–34.0)
MCHC: 30.9 g/dL (ref 30.0–36.0)
MCV: 93.7 fL (ref 78.0–100.0)
Platelets: 460 10*3/uL — ABNORMAL HIGH (ref 150–400)
RBC: 2.52 MIL/uL — ABNORMAL LOW (ref 4.22–5.81)
RDW: 18.3 % — AB (ref 11.5–15.5)
WBC: 10.3 10*3/uL (ref 4.0–10.5)

## 2013-08-10 SURGERY — INSERTION, PEG TUBE

## 2013-08-10 SURGERY — INSERTION, PEG TUBE
Anesthesia: Choice

## 2013-08-10 MED ORDER — VECURONIUM BROMIDE 10 MG IV SOLR
10.0000 mg | Freq: Once | INTRAVENOUS | Status: AC
  Administered 2013-08-10: 10 mg via INTRAVENOUS

## 2013-08-10 MED ORDER — MIDAZOLAM HCL 2 MG/2ML IJ SOLN
2.0000 mg | Freq: Once | INTRAMUSCULAR | Status: AC
  Administered 2013-08-10: 2 mg via INTRAVENOUS

## 2013-08-10 MED ORDER — ENOXAPARIN SODIUM 40 MG/0.4ML ~~LOC~~ SOLN
40.0000 mg | SUBCUTANEOUS | Status: DC
  Administered 2013-08-12 – 2013-08-23 (×12): 40 mg via SUBCUTANEOUS
  Filled 2013-08-10 (×15): qty 0.4

## 2013-08-10 MED ORDER — MIDAZOLAM HCL 2 MG/2ML IJ SOLN
INTRAMUSCULAR | Status: AC
  Filled 2013-08-10: qty 4

## 2013-08-10 MED ORDER — FENTANYL CITRATE 0.05 MG/ML IJ SOLN: 50.0000 ug | Freq: Once | INTRAMUSCULAR | Status: DC

## 2013-08-10 MED ORDER — VECURONIUM BROMIDE 10 MG IV SOLR
INTRAVENOUS | Status: AC
  Filled 2013-08-10: qty 10

## 2013-08-10 SURGICAL SUPPLY — 21 items
DRAPE PROXIMA HALF (DRAPES) ×8 IMPLANT
DRAPE UTILITY 15X26 W/TAPE STR (DRAPE) ×8 IMPLANT
ELECT CAUTERY BLADE 6.4 (BLADE) ×4 IMPLANT
ELECT REM PT RETURN 9FT ADLT (ELECTROSURGICAL) ×3
ELECTRODE REM PT RTRN 9FT ADLT (ELECTROSURGICAL) ×2 IMPLANT
GAUZE SPONGE 4X4 16PLY XRAY LF (GAUZE/BANDAGES/DRESSINGS) ×4 IMPLANT
GLOVE BIO SURGEON STRL SZ8 (GLOVE) ×8 IMPLANT
GLOVE BIOGEL PI IND STRL 8 (GLOVE) ×4 IMPLANT
GLOVE BIOGEL PI INDICATOR 8 (GLOVE) ×4
GOWN STRL NON-REIN LRG LVL3 (GOWN DISPOSABLE) ×4 IMPLANT
GOWN STRL REIN XL XLG (GOWN DISPOSABLE) ×8 IMPLANT
INTRODUCER TRACH BLUE RHINO 6F (TUBING) IMPLANT
INTRODUCER TRACH BLUE RHINO 8F (TUBING) IMPLANT
PENCIL BUTTON HOLSTER BLD 10FT (ELECTRODE) ×4 IMPLANT
SPONGE INTESTINAL PEANUT (DISPOSABLE) ×4 IMPLANT
SUT SILK 3 0 SH CR/8 (SUTURE) ×4 IMPLANT
SUT VICRYL AB 3 0 TIES (SUTURE) ×4 IMPLANT
TOWEL OR 17X24 6PK STRL BLUE (TOWEL DISPOSABLE) ×4 IMPLANT
TUBE CONNECTING 12'X1/4 (SUCTIONS) ×1
TUBE CONNECTING 12X1/4 (SUCTIONS) ×3 IMPLANT
YANKAUER SUCT BULB TIP NO VENT (SUCTIONS) ×4 IMPLANT

## 2013-08-10 SURGICAL SUPPLY — 1 items: Boston Scientific PEG (Peg) ×2 IMPLANT

## 2013-08-10 NOTE — Progress Notes (Signed)
OT Cancellation Note  Patient Details Name: [name] [name] Sexson MRN: [id number] DOB: 08/30/1930   Cancelled Treatment:    Reason Eval/Treat Not Completed: Patient at procedure or test/ unavailable. Pt for peg and trach today.  [name], [name] [phone] 08/10/2013, 9:55 AM
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
## 2013-08-10 NOTE — Progress Notes (Signed)
Speech Language Pathology Discharge Patient Details Name: Dorena DewJerry Crotteau MRN: [id number] DOB: 08/14/1930 Today's Date: 08/10/2013 Time:  -     Patient discharged from SLP services secondary to medical decline - will need to re-order SLP to resume therapy services.  Please see latest therapy progress note for current level of functioning and progress toward goals.    Progress and discharge plan discussed with patient and/or caregiver: not discussed with pt.  GO     Breck CoonsLisa Willis Sarha Bartelt M.Ed [employer]SLP Pager [phone]  08/10/2013

## 2013-08-10 NOTE — Progress Notes (Signed)
Orthopedic Tech Progress Note Patient Details:  [name] [name] 06/05/1931 [id number]  Ortho Devices Type of Ortho Device: Abdominal binder Ortho Device/Splint Location: abdomen Ortho Device/Splint Interventions: Ordered Left abdominal binder in room with pt; rn notified  [name], [name] 08/10/2013, 3:30 PM

## 2013-08-10 NOTE — Progress Notes (Signed)
PT Cancellation Note  Patient Details Name: [name] DewJerry Akhavan MRN: [id number] DOB: 05/13/1931   Cancelled Treatment:    Reason Eval/Treat Not Completed: Patient not medically ready. RN asked to hold today due to pt going for trach and peg this date. PT to return when appropriate.   [name], Vence Lalor Marie 08/10/2013, 9:28 AM

## 2013-08-10 NOTE — Progress Notes (Signed)
UR completed.  Caledonia Zou, RN BSN MHA CCM Trauma/Neuro ICU Case Manager [phone]  
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
## 2013-08-10 NOTE — Progress Notes (Signed)
Patient ID: [name] [name], male   DOB: 05/10/1931, 80 y.o.   MRN: [id number] PEG tube placed.  No complications.  Clamp today.  Start TF tomorrow. Formal procedure note to follow. We will have to reschedule trach placement.   Burns Timson, ANP-BC
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
## 2013-08-11 ENCOUNTER — Encounter (HOSPITAL_COMMUNITY): Payer: Self-pay | Admitting: General Surgery

## 2013-08-11 ENCOUNTER — Inpatient Hospital Stay (HOSPITAL_COMMUNITY): Payer: No Typology Code available for payment source

## 2013-08-11 ENCOUNTER — Encounter (HOSPITAL_COMMUNITY): Admission: EM | Disposition: A | Discharge status: Skilled Nursing Facility | Payer: Self-pay | Source: Home / Self Care

## 2013-08-11 DIAGNOSIS — J9600 Acute respiratory failure, unspecified whether with hypoxia or hypercapnia: Secondary | ICD-10-CM

## 2013-08-11 DIAGNOSIS — Z9911 Dependence on respirator [ventilator] status: Secondary | ICD-10-CM

## 2013-08-11 HISTORY — PX: PERCUTANEOUS TRACHEOSTOMY: SHX5288

## 2013-08-11 LAB — BASIC METABOLIC PANEL
BUN: 29 mg/dL — ABNORMAL HIGH (ref 6–23)
CO2: 24 mEq/L (ref 19–32)
CREATININE: 0.66 mg/dL (ref 0.50–1.35)
Calcium: 7.3 mg/dL — ABNORMAL LOW (ref 8.4–10.5)
Chloride: 101 mEq/L (ref 96–112)
GFR calc Af Amer: >90 mL/min (ref >90)
GFR, EST NON AFRICAN AMERICAN: 89 mL/min — AB (ref >90)
Glucose, Bld: 89 mg/dL (ref 70–99)
Potassium: 5.1 mEq/L (ref 3.7–5.3)
SODIUM: 134 meq/L — AB (ref 137–147)

## 2013-08-11 LAB — CBC
HCT: 22.5 % — ABNORMAL LOW (ref 39.0–52.0)
Hemoglobin: 7.3 g/dL — ABNORMAL LOW (ref 13.0–17.0)
MCH: 30 pg (ref 26.0–34.0)
MCHC: 32.4 g/dL (ref 30.0–36.0)
MCV: 92.6 fL (ref 78.0–100.0)
PLATELETS: 441 10*3/uL — AB (ref 150–400)
RBC: 2.43 MIL/uL — ABNORMAL LOW (ref 4.22–5.81)
RDW: 18.1 % — AB (ref 11.5–15.5)
WBC: 12.4 10*3/uL — ABNORMAL HIGH (ref 4.0–10.5)

## 2013-08-11 LAB — GLUCOSE, CAPILLARY
Glucose-Capillary: 90 mg/dL (ref 70–99)
Glucose-Capillary: 95 mg/dL (ref 70–99)

## 2013-08-11 SURGERY — CREATION, TRACHEOSTOMY, PERCUTANEOUS
Anesthesia: Choice

## 2013-08-11 MED ORDER — FENTANYL BOLUS VIA INFUSION
50.0000 ug | Freq: Once | INTRAVENOUS | Status: AC
  Administered 2013-08-11: 50 ug via INTRAVENOUS

## 2013-08-11 MED ORDER — PIVOT 1.5 CAL PO LIQD
1000.0000 mL | ORAL | Status: DC
  Filled 2013-08-11 (×2): qty 1000

## 2013-08-11 MED ORDER — MIDAZOLAM HCL 2 MG/2ML IJ SOLN
INTRAMUSCULAR | Status: AC
  Administered 2013-08-11: 2 mg
  Filled 2013-08-11: qty 4

## 2013-08-11 MED ORDER — SODIUM CHLORIDE 0.9 % IV SOLN
1.0000 g | Freq: Once | INTRAVENOUS | Status: AC
  Administered 2013-08-11: 1 g via INTRAVENOUS
  Filled 2013-08-11: qty 10

## 2013-08-11 MED ORDER — LIDOCAINE-EPINEPHRINE 1.5 %-1:200,000 OPTIME - NO CHARGE
INTRAMUSCULAR | Status: DC | PRN
  Administered 2013-08-11: 4 mL via SUBCUTANEOUS

## 2013-08-11 MED ORDER — FREE WATER
200.0000 mL | Freq: Three times a day (TID) | Status: DC
  Administered 2013-08-11 – 2013-08-13 (×6): 200 mL

## 2013-08-11 MED ORDER — SODIUM CHLORIDE 0.9 % IR SOLN
Status: DC | PRN
  Administered 2013-08-11: 30 mL

## 2013-08-11 MED ORDER — FENTANYL NICU BOLUS VIA INFUSION: 50.0000 ug | Freq: Once | INTRAVENOUS | Status: DC

## 2013-08-11 MED ORDER — VECURONIUM BROMIDE 10 MG IV SOLR
INTRAVENOUS | Status: AC
  Administered 2013-08-11: 10 mg
  Filled 2013-08-11: qty 10

## 2013-08-11 MED ORDER — PIVOT 1.5 CAL PO LIQD
1000.0000 mL | ORAL | Status: DC
  Administered 2013-08-11 – 2013-08-20 (×10): 1000 mL
  Filled 2013-08-11 (×13): qty 1000

## 2013-08-11 MED ORDER — MIDAZOLAM HCL 2 MG/2ML IJ SOLN
2.0000 mg | Freq: Once | INTRAMUSCULAR | Status: AC
  Administered 2013-08-11: 2 mg via INTRAVENOUS

## 2013-08-11 SURGICAL SUPPLY — 27 items
DRAPE PROXIMA HALF (DRAPES) ×6 IMPLANT
DRAPE UTILITY 15X26 W/TAPE STR (DRAPE) ×6 IMPLANT
ELECT CAUTERY BLADE 6.4 (BLADE) ×3 IMPLANT
ELECT REM PT RETURN 9FT ADLT (ELECTROSURGICAL) ×3
ELECTRODE REM PT RTRN 9FT ADLT (ELECTROSURGICAL) ×1 IMPLANT
GAUZE SPONGE 4X4 16PLY XRAY LF (GAUZE/BANDAGES/DRESSINGS) ×3 IMPLANT
GLOVE BIO SURGEON STRL SZ 6.5 (GLOVE) ×1 IMPLANT
GLOVE BIO SURGEON STRL SZ7.5 (GLOVE) ×4 IMPLANT
GLOVE BIO SURGEON STRL SZ8 (GLOVE) ×6 IMPLANT
GLOVE BIO SURGEONS STRL SZ 6.5 (GLOVE) ×1
GLOVE BIOGEL PI IND STRL 7.0 (GLOVE) IMPLANT
GLOVE BIOGEL PI IND STRL 8 (GLOVE) ×2 IMPLANT
GLOVE BIOGEL PI INDICATOR 7.0 (GLOVE) ×2
GLOVE BIOGEL PI INDICATOR 8 (GLOVE) ×4
GOWN STRL NON-REIN LRG LVL3 (GOWN DISPOSABLE) ×5 IMPLANT
GOWN STRL REIN XL XLG (GOWN DISPOSABLE) ×6 IMPLANT
INTRODUCER TRACH BLUE RHINO 6F (TUBING) ×2 IMPLANT
INTRODUCER TRACH BLUE RHINO 8F (TUBING) IMPLANT
PENCIL BUTTON HOLSTER BLD 10FT (ELECTRODE) ×3 IMPLANT
SPONGE DRAIN TRACH 4X4 STRL 2S (GAUZE/BANDAGES/DRESSINGS) ×2 IMPLANT
SPONGE INTESTINAL PEANUT (DISPOSABLE) ×3 IMPLANT
SUT SILK 3 0 SH CR/8 (SUTURE) ×3 IMPLANT
SUT VICRYL AB 3 0 TIES (SUTURE) ×3 IMPLANT
TOWEL OR 17X24 6PK STRL BLUE (TOWEL DISPOSABLE) ×3 IMPLANT
TUBE CONNECTING 12'X1/4 (SUCTIONS) ×1
TUBE CONNECTING 12X1/4 (SUCTIONS) ×2 IMPLANT
YANKAUER SUCT BULB TIP NO VENT (SUCTIONS) ×3 IMPLANT
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
## 2013-08-11 NOTE — Op Note (Signed)
08/11/2013  11:15 AM  PATIENT:  [name] [name]  80 y.o. male  PRE-OPERATIVE DIAGNOSIS:  Ventilator dependent respiratory failure  POST-OPERATIVE DIAGNOSIS:  Ventilator dependent respiratory failure  PROCEDURE:  Procedure(s): PERCUTANEOUS TRACHEOSTOMY #6 Shiley  SURGEON:  Surgeon(s): Liz MaladyBurke E Treshon Stannard, MD   ASSISTANTS: Anette RiedelEmina Riebock, ARNP   ANESTHESIA:   local, IV sedation and muscle relaxation  EBL:  Total I/O In: 120 [I.V.:10; IV Piggyback:110] Out: -   BLOOD ADMINISTERED:none  DRAINS: none   SPECIMEN:  No Specimen  DISPOSITION OF SPECIMEN:  N/A  COUNTS:  YES  DICTATION: .Dragon Dictation patient was identified in the neurotrauma intensive care unit. He remained on hemodynamic monitoring throughout. Time out procedure was performed. Informed consent had been obtained from the family. He is on antibiotic protocol intravenously. His neck was prepped and draped in sterile fashion. Local anesthetic was injected 2 cm above the sternal notch. A transverse incision was made. Right anterior jugular vein was large and dissected out. It was ligated on each side with 3-0 silk and divided. Left anterior jugular was small and was cauterized. Further dissection continued down through the platysma identifying the strap muscles. These were divided along the midline. Thyroid isthmus was then divided with cautery achieving careful good hemostasis. Anterior surface of the trachea was identified. Endotracheal tube was pulled back by respiratory therapy under direct palpation to the appropriate level. Angiocath was inserted between the second and third tracheal ring. Trach hook was used for retraction.Guidewire was placed followed by the small blue dilator. Next, the large blue rhino dilator was inserted. Finally, a #6 Shiley tracheostomy was inserted over a 24 French blue rhino dilator.It passed without difficulty. Cuff was inflated, inner cannula was placed, and it was hooked up to the ventilator  circuit. Excellent volume returns were obtained. Saturations remained 100%. Dressing sponge was placed in the tracheostomy was secured to the skin with 2-0 Prolene x4. Velcro trach tie was placed. Oral endotracheal and orogastric tubes were removed by respiratory therapy. This completed the procedure. All counts were correct. He tolerated the procedure without apparent complication. He remains on the ventilator in stable condition. We'll check a chest x-ray.  PATIENT DISPOSITION:  ICU - intubated and hemodynamically stable.   Delay start of Pharmacological VTE agent (>24hrs) due to surgical blood loss or risk of bleeding:  no  Violeta GelinasBurke Nahjae Hoeg, MD, MPH, FACS Pager: [phone]  1/27/201511:15 AM
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
## 2013-08-12 ENCOUNTER — Inpatient Hospital Stay (HOSPITAL_COMMUNITY): Payer: No Typology Code available for payment source

## 2013-08-12 LAB — CBC
HCT: 19.8 % — ABNORMAL LOW (ref 39.0–52.0)
Hemoglobin: 6.5 g/dL — CL (ref 13.0–17.0)
MCH: 29.7 pg (ref 26.0–34.0)
MCHC: 32.8 g/dL (ref 30.0–36.0)
MCV: 90.4 fL (ref 78.0–100.0)
PLATELETS: 459 10*3/uL — AB (ref 150–400)
RBC: 2.19 MIL/uL — AB (ref 4.22–5.81)
RDW: 17.9 % — ABNORMAL HIGH (ref 11.5–15.5)
WBC: 12.5 10*3/uL — ABNORMAL HIGH (ref 4.0–10.5)

## 2013-08-12 LAB — GLUCOSE, CAPILLARY
GLUCOSE-CAPILLARY: 125 mg/dL — AB (ref 70–99)
GLUCOSE-CAPILLARY: 131 mg/dL — AB (ref 70–99)
Glucose-Capillary: 107 mg/dL — ABNORMAL HIGH (ref 70–99)
Glucose-Capillary: 125 mg/dL — ABNORMAL HIGH (ref 70–99)
Glucose-Capillary: 126 mg/dL — ABNORMAL HIGH (ref 70–99)
Glucose-Capillary: 147 mg/dL — ABNORMAL HIGH (ref 70–99)
Glucose-Capillary: 172 mg/dL — ABNORMAL HIGH (ref 70–99)

## 2013-08-12 LAB — BASIC METABOLIC PANEL
BUN: 23 mg/dL (ref 6–23)
CO2: 25 meq/L (ref 19–32)
CREATININE: 0.72 mg/dL (ref 0.50–1.35)
Calcium: 6.9 mg/dL — ABNORMAL LOW (ref 8.4–10.5)
Chloride: 102 mEq/L (ref 96–112)
GFR calc Af Amer: >90 mL/min (ref >90)
GFR, EST NON AFRICAN AMERICAN: 86 mL/min — AB (ref >90)
Glucose, Bld: 120 mg/dL — ABNORMAL HIGH (ref 70–99)
Potassium: 4.2 mEq/L (ref 3.7–5.3)
Sodium: 137 mEq/L (ref 137–147)

## 2013-08-12 LAB — PREPARE RBC (CROSSMATCH)

## 2013-08-12 MED ORDER — SODIUM CHLORIDE 0.9 % IV SOLN
1.0000 g | Freq: Once | INTRAVENOUS | Status: AC
  Administered 2013-08-12: 1 g via INTRAVENOUS
  Filled 2013-08-12: qty 10

## 2013-08-12 NOTE — Progress Notes (Signed)
CRITICAL VALUE ALERT  Critical value received:  Hgb   Date of notification:  08/12/13  Time of notification:  0603  Critical value read back:yes  Nurse who received alert:  Docia BarrierJosh Rilyn Scroggs  MD notified (1st page):  Dr. Corliss Skainssuei  Time of first page:  0610  MD notified (2nd page):  Time of second page:  Responding MD:  Dr. Corliss Skainssuei  Time MD responded:  [phone]
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
## 2013-08-13 ENCOUNTER — Encounter (HOSPITAL_COMMUNITY): Payer: Self-pay | Admitting: General Surgery

## 2013-08-13 LAB — CBC
HCT: 27.1 % — ABNORMAL LOW (ref 39.0–52.0)
Hemoglobin: 9 g/dL — ABNORMAL LOW (ref 13.0–17.0)
MCH: 29.8 pg (ref 26.0–34.0)
MCHC: 33.2 g/dL (ref 30.0–36.0)
MCV: 89.7 fL (ref 78.0–100.0)
Platelets: 376 10*3/uL (ref 150–400)
RBC: 3.02 MIL/uL — AB (ref 4.22–5.81)
RDW: 17.4 % — ABNORMAL HIGH (ref 11.5–15.5)
WBC: 11.8 10*3/uL — ABNORMAL HIGH (ref 4.0–10.5)

## 2013-08-13 LAB — GLUCOSE, CAPILLARY
GLUCOSE-CAPILLARY: 124 mg/dL — AB (ref 70–99)
GLUCOSE-CAPILLARY: 113 mg/dL — AB (ref 70–99)
GLUCOSE-CAPILLARY: 137 mg/dL — AB (ref 70–99)
Glucose-Capillary: 110 mg/dL — ABNORMAL HIGH (ref 70–99)
Glucose-Capillary: 120 mg/dL — ABNORMAL HIGH (ref 70–99)
Glucose-Capillary: 136 mg/dL — ABNORMAL HIGH (ref 70–99)

## 2013-08-13 LAB — TYPE AND SCREEN
ABO/RH(D): B POS
Antibody Screen: NEGATIVE
Unit division: 0
Unit division: 0

## 2013-08-13 LAB — BASIC METABOLIC PANEL
BUN: 27 mg/dL — ABNORMAL HIGH (ref 6–23)
CO2: 27 meq/L (ref 19–32)
Calcium: 6.8 mg/dL — ABNORMAL LOW (ref 8.4–10.5)
Chloride: 104 mEq/L (ref 96–112)
Creatinine, Ser: 0.66 mg/dL (ref 0.50–1.35)
GFR calc Af Amer: >90 mL/min (ref >90)
GFR, EST NON AFRICAN AMERICAN: 89 mL/min — AB (ref >90)
Glucose, Bld: 132 mg/dL — ABNORMAL HIGH (ref 70–99)
POTASSIUM: 3.8 meq/L (ref 3.7–5.3)
SODIUM: 139 meq/L (ref 137–147)

## 2013-08-13 LAB — CALCIUM, IONIZED: CALCIUM ION: 1.05 mmol/L — AB (ref 1.13–1.30)

## 2013-08-13 MED ORDER — FREE WATER
200.0000 mL | Freq: Two times a day (BID) | Status: DC
  Administered 2013-08-13 – 2013-08-22 (×18): 200 mL

## 2013-08-13 MED ORDER — SODIUM CHLORIDE 0.9 % IV SOLN
1.0000 g | Freq: Once | INTRAVENOUS | Status: DC
  Filled 2013-08-13: qty 10

## 2013-08-13 MED ORDER — SODIUM CHLORIDE 0.9 % IV SOLN
2.0000 g | Freq: Once | INTRAVENOUS | Status: AC
  Administered 2013-08-13: 2 g via INTRAVENOUS
  Filled 2013-08-13: qty 20
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
## 2013-08-14 LAB — BASIC METABOLIC PANEL
BUN: 23 mg/dL (ref 6–23)
CHLORIDE: 102 meq/L (ref 96–112)
CO2: 26 mEq/L (ref 19–32)
Calcium: 7.4 mg/dL — ABNORMAL LOW (ref 8.4–10.5)
Creatinine, Ser: 0.61 mg/dL (ref 0.50–1.35)
GFR calc Af Amer: >90 mL/min (ref >90)
GFR calc non Af Amer: >90 mL/min (ref >90)
Glucose, Bld: 136 mg/dL — ABNORMAL HIGH (ref 70–99)
POTASSIUM: 3.8 meq/L (ref 3.7–5.3)
SODIUM: 138 meq/L (ref 137–147)

## 2013-08-14 LAB — GLUCOSE, CAPILLARY
Glucose-Capillary: 142 mg/dL — ABNORMAL HIGH (ref 70–99)
Glucose-Capillary: 139 mg/dL — ABNORMAL HIGH (ref 70–99)
Glucose-Capillary: 133 mg/dL — ABNORMAL HIGH (ref 70–99)
Glucose-Capillary: 127 mg/dL — ABNORMAL HIGH (ref 70–99)
Glucose-Capillary: 154 mg/dL — ABNORMAL HIGH (ref 70–99)

## 2013-08-14 LAB — CALCIUM, IONIZED: Calcium, Ion: 1.06 mmol/L — ABNORMAL LOW (ref 1.13–1.30)

## 2013-08-14 LAB — CBC
HEMATOCRIT: 30.3 % — AB (ref 39.0–52.0)
Hemoglobin: 10 g/dL — ABNORMAL LOW (ref 13.0–17.0)
MCH: 29.9 pg (ref 26.0–34.0)
MCHC: 33 g/dL (ref 30.0–36.0)
MCV: 90.7 fL (ref 78.0–100.0)
Platelets: 466 10*3/uL — ABNORMAL HIGH (ref 150–400)
RBC: 3.34 MIL/uL — ABNORMAL LOW (ref 4.22–5.81)
RDW: 17 % — AB (ref 11.5–15.5)
WBC: 16.3 10*3/uL — AB (ref 4.0–10.5)

## 2013-08-14 MED ORDER — HYDROCODONE-ACETAMINOPHEN 7.5-325 MG/15ML PO SOLN
5.0000 mL | ORAL | Status: DC | PRN
  Administered 2013-08-14: 15 mL via ORAL
  Administered 2013-08-14: 5 mL via ORAL
  Administered 2013-08-14 – 2013-08-19 (×7): 15 mL via ORAL
  Filled 2013-08-14 (×9): qty 15

## 2013-08-14 MED ORDER — DIPHENHYDRAMINE HCL 12.5 MG/5ML PO ELIX
12.5000 mg | ORAL_SOLUTION | Freq: Four times a day (QID) | ORAL | Status: DC
  Administered 2013-08-14 – 2013-08-20 (×24): 12.5 mg
  Filled 2013-08-14 (×29): qty 5

## 2013-08-14 MED ORDER — METOPROLOL TARTRATE 25 MG/10 ML ORAL SUSPENSION
50.0000 mg | Freq: Two times a day (BID) | ORAL | Status: DC
  Administered 2013-08-14 – 2013-08-19 (×11): 50 mg
  Filled 2013-08-14 (×13): qty 20
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
## 2013-08-14 NOTE — Progress Notes (Signed)
10ml Hycet wasted in sink with Marlana Latusasey Bruno, RN.

## 2013-08-14 NOTE — Progress Notes (Signed)
Cristy HiltsMicheal Jeffery, PA-C made aware patient with increased anxiety and agitation this morning. Seems a little delusional at times. Weaned fentanyl gtt to 30 mcgs/hr from 2960 mcgs/hr with intentions to wean off. Also made aware patient has a new rash to trunk/abdomen/periarea first assessed overnight. Orders received and carried out. Continuing to monitor.
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
## 2013-08-14 NOTE — Progress Notes (Signed)
Nurse reports increased secretions and abd wall/truncal rash Tolerated TC  Pt awake, MAE, trying to talk Reg Soft, nt, nd. g tube ok. +rash ext to groin No edema  Rash - monitor. prob drug related. vanc is due to end tomorrow anyway.  Cont suctioning as needed for secretions Monitor wbc - increased today, no fever.  Ok to tx to SDU  I saw the patient, participated in the exam and medical decision making, and concur with the physician assistant's note above.  Mary SellaEric M. Andrey CampanileWilson, MD, FACS General, Bariatric, & Minimally Invasive Surgery [facility], [place]

## 2013-08-14 NOTE — Progress Notes (Signed)
3 Days Post-Op  Subjective: He is awake and seems to be alert. He is still on the ventilator. He doesn't seem to have any complaints regarding his eye.  Objective: Vital signs in last 24 hours: Temp:  [99 F (37.2 C)-100 F (37.8 C)] 99 F (37.2 C) (01/29 2339) Pulse Rate:  [84-121] 103 (01/30 0700) Resp:  [12-30] 22 (01/30 0700) BP: (121-194)/(55-88) 163/75 mmHg (01/30 0700) SpO2:  [90 %-100 %] 100 % (01/30 0700) FiO2 (%):  [28 %-40 %] 28 % (01/30 0410) Weight:  [66.8 kg (147 lb 4.3 oz)] 66.8 kg (147 lb 4.3 oz) (01/30 0500) Last BM Date: 08/13/13  Intake/Output from previous day: 01/29 0701 - 01/30 0700 In: 2062.1 [I.V.:132.1; NG/GT:1150; IV Piggyback:520] Out: 800 [Urine:800] Intake/Output this shift:    He seems to be awake and alert. Is difficult to communicate secondary to the tracheotomy. He does follow commands and does not appear to have any limitation of extraocular motor muscles. His vision appears to be intact in his eyes have no swelling or erythema. Palpation of his orbital rim seems to be without step offs. Nose is clear. Oral cavity oropharynx no lesions and doesn't seem to have a trismus  Lab Results:   Recent Labs  08/13/13 0545 08/14/13 0520  WBC 11.8* 16.3*  HGB 9.0* 10.0*  HCT 27.1* 30.3*  PLT 376 466*   BMET  Recent Labs  08/13/13 0545 08/14/13 0520  NA 139 138  K 3.8 3.8  CL 104 102  CO2 27 26  GLUCOSE 132* 136*  BUN 27* 23  CREATININE 0.66 0.61  CALCIUM 6.8* 7.4*   PT/INR No results found for this basename: LABPROT, INR,  in the last 72 hours ABG No results found for this basename: PHART, PCO2, PO2, HCO3,  in the last 72 hours  Studies/Results: No results found.  Anti-infectives: Anti-infectives   Start     Dose/Rate Route Frequency Ordered Stop   08/06/13 1100  vancomycin (VANCOCIN) IVPB 1000 mg/200 mL premix     1,000 mg 200 mL/hr over 60 Minutes Intravenous Every 12 hours 08/06/13 1032     07/29/13 1300  ceFAZolin (ANCEF)  IVPB 2 g/50 mL premix  Status:  Discontinued     2 g 100 mL/hr over 30 Minutes Intravenous  Once 07/29/13 1249 07/29/13 1450   07/28/13 1400  ceFAZolin (ANCEF) IVPB 2 g/50 mL premix     2 g 100 mL/hr over 30 Minutes Intravenous  Once 07/28/13 1356 07/28/13 1810   07/27/13 1617  ceFAZolin (ANCEF) 2 g in dextrose 5 % 50 mL IVPB     2 g 140 mL/hr over 30 Minutes Intravenous  Once 07/27/13 1618 07/27/13 1856      Assessment/Plan: s/p Procedure(s) with comments: PERCUTANEOUS TRACHEOSTOMY (N/A) - BESIDE TRACH He needs to have the sutures removed from the wound of the right eyebrow. Most likely he will not need any surgical intervention for this fracture as this would be a cosmetic issue and he doesn't by vision or palpation have any deformity. It appears his eye functions well. I will followup to remove the sutures in a few days.  LOS: 18 days    Suzanna ObeyBYERS, [name] [name] 08/14/2013

## 2013-08-14 NOTE — Progress Notes (Signed)
Dr. Andrey CampanileWilson at bedside and updated on patient condition: Lots of secretions and suctioning this a.m., increased O2 to 40% and 10L, increased anxiety and agitation today, patient delusional at times saying there is smoke in the room and animals. Rash assessed. Patient receiving scheduled benadryl. Continuing to monitor closely.
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
## 2013-08-15 LAB — GLUCOSE, CAPILLARY
GLUCOSE-CAPILLARY: 123 mg/dL — AB (ref 70–99)
GLUCOSE-CAPILLARY: 120 mg/dL — AB (ref 70–99)
Glucose-Capillary: 116 mg/dL — ABNORMAL HIGH (ref 70–99)

## 2013-08-15 LAB — CBC
HCT: 30 % — ABNORMAL LOW (ref 39.0–52.0)
HEMOGLOBIN: 9.8 g/dL — AB (ref 13.0–17.0)
MCH: 29.9 pg (ref 26.0–34.0)
MCHC: 32.7 g/dL (ref 30.0–36.0)
MCV: 91.5 fL (ref 78.0–100.0)
Platelets: 439 10*3/uL — ABNORMAL HIGH (ref 150–400)
RBC: 3.28 MIL/uL — AB (ref 4.22–5.81)
RDW: 17.1 % — ABNORMAL HIGH (ref 11.5–15.5)
WBC: 14.6 10*3/uL — ABNORMAL HIGH (ref 4.0–10.5)

## 2013-08-15 MED ORDER — PIPERACILLIN-TAZOBACTAM 3.375 G IVPB
3.3750 g | Freq: Three times a day (TID) | INTRAVENOUS | Status: DC
  Administered 2013-08-15 – 2013-08-17 (×6): 3.375 g via INTRAVENOUS
  Filled 2013-08-15 (×8): qty 50

## 2013-08-15 MED ORDER — CIPROFLOXACIN IN D5W 400 MG/200ML IV SOLN
400.0000 mg | Freq: Two times a day (BID) | INTRAVENOUS | Status: AC
  Administered 2013-08-15 – 2013-08-21 (×14): 400 mg via INTRAVENOUS
  Filled 2013-08-15 (×15): qty 200

## 2013-08-15 MED ORDER — ALTEPLASE 100 MG IV SOLR
2.0000 mg | Freq: Once | INTRAVENOUS | Status: AC
  Administered 2013-08-15: 2 mg
  Filled 2013-08-15 (×2): qty 2

## 2013-08-15 NOTE — Progress Notes (Signed)
SLP Cancellation Note  Patient Details Name: [name] [name] MRN: [id number] DOB: 04/15/1931   Cancelled treatment:       Reason Eval/Treat Not Completed: Medical issues which prohibited therapy. RN requested SLP hold evaluation today reporting significant secretions and desaturations.   Ferdinand LangoLeah Chancy Smigiel MA, CCC-SLP [phone]    Tareka Jhaveri Meryl 08/15/2013, 9:21 AM
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
## 2013-08-17 ENCOUNTER — Encounter (HOSPITAL_COMMUNITY): Payer: Self-pay | Admitting: *Deleted

## 2013-08-17 ENCOUNTER — Inpatient Hospital Stay (HOSPITAL_COMMUNITY): Payer: No Typology Code available for payment source

## 2013-08-17 LAB — BASIC METABOLIC PANEL
BUN: 25 mg/dL — AB (ref 6–23)
CHLORIDE: 104 meq/L (ref 96–112)
CO2: 28 mEq/L (ref 19–32)
Calcium: 7.5 mg/dL — ABNORMAL LOW (ref 8.4–10.5)
Creatinine, Ser: 0.63 mg/dL (ref 0.50–1.35)
GFR calc Af Amer: >90 mL/min (ref >90)
GFR calc non Af Amer: >90 mL/min (ref >90)
GLUCOSE: 128 mg/dL — AB (ref 70–99)
Potassium: 3.6 mEq/L — ABNORMAL LOW (ref 3.7–5.3)
Sodium: 143 mEq/L (ref 137–147)

## 2013-08-17 LAB — CBC
HEMATOCRIT: 31.2 % — AB (ref 39.0–52.0)
Hemoglobin: 10.1 g/dL — ABNORMAL LOW (ref 13.0–17.0)
MCH: 29.9 pg (ref 26.0–34.0)
MCHC: 32.4 g/dL (ref 30.0–36.0)
MCV: 92.3 fL (ref 78.0–100.0)
Platelets: 451 10*3/uL — ABNORMAL HIGH (ref 150–400)
RBC: 3.38 MIL/uL — ABNORMAL LOW (ref 4.22–5.81)
RDW: 17 % — ABNORMAL HIGH (ref 11.5–15.5)
WBC: 11.9 10*3/uL — AB (ref 4.0–10.5)

## 2013-08-17 LAB — CULTURE, RESPIRATORY W GRAM STAIN: Special Requests: NORMAL

## 2013-08-17 LAB — CULTURE, RESPIRATORY

## 2013-08-17 MED ORDER — IOHEXOL 350 MG/ML SOLN
100.0000 mL | Freq: Once | INTRAVENOUS | Status: AC | PRN
  Administered 2013-08-17: 100 mL via INTRAVENOUS

## 2013-08-17 NOTE — Progress Notes (Signed)
Pt arrived back to unit by transporter. Portable oxygen tank empty, SpO2 85%. Placed back on trach collar at 28% FiO2. Will continue to monitor.

## 2013-08-17 NOTE — Progress Notes (Signed)
Pt had stool from head to toes. Pt R hand and fingers covered in stool. Explained to patient call light, he is able to use TV remote/buttons. Pt was upset with staff that this happened to him. Explained incontinence to patient and to not touch bowel movement. Pt cleaned and calmed. Trach split guaze changed again. Will continue to monitor.

## 2013-08-17 NOTE — Procedures (Signed)
Successful US guided left thoracentesis. Yielded 470mL of bloody pleural fluid. Pt tolerated procedure well. No immediate complications.  Specimen was sent for labs. CXR ordered.  Brayton ElBRUNING, Leone Mobley PA-C 08/17/2013 2:56 PM

## 2013-08-17 NOTE — Progress Notes (Signed)
Left hip staples removed (15), left lateral distal thigh staples removed (3). No complications, tolerated well, site CDI, OTA, reddened.

## 2013-08-17 NOTE — Progress Notes (Signed)
Physician notified: Ophelia CharterYates At: 1624  Regarding: DC staples in left hip/leg and left arm? Well approximated, pink, red, no drainage.    In OR, will come see patient today.

## 2013-08-17 NOTE — Progress Notes (Signed)
For L thoracentesis. Cipro for Serratia tracheobronchitis. Good cough and better spirits. Patient examined and I agree with the assessment and plan  Violeta GelinasBurke Lahoma Constantin, MD, MPH, FACS Pager: [phone]  08/17/2013 2:55 PM
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
## 2013-08-17 NOTE — Progress Notes (Signed)
PT Cancellation Note  Patient Details Name: [name] [name] MRN: [id number] DOB: 03/19/1931   Cancelled Treatment:    Reason Eval/Treat Not Completed: Patient at procedure or test/unavailable. Attempted x 2, pt off floor for doppler study then down for thorentisesis. PT to return as able.   [name], Quinten Allerton Marie 08/17/2013, 2:39 PM

## 2013-08-17 NOTE — Progress Notes (Signed)
Wean oxygen per trach collar to 28% 5L, 91% SpO2  Attempted to wean left wrist restraint, NT watching patient outside of room. Within 5 minutes, pt grabbed oxygen tubing and pulled off collar. Restraint reapplied.

## 2013-08-17 NOTE — Clinical Social Work Note (Signed)
Clinical Social Worker continuing to follow patient and family for support and discharge planning needs.  CSW left message with patient son [name](Griselda) regarding the upcoming need for SNF placement.  Patient with new trach placement and is weaning to 28% trach collar.  CSW to further discuss with family about placement options at discharge.  CSW remains available for support and to facilitate patient discharge needs once medically appropriate.  Macario GoldsJesse Devetta Hagenow, [place] [phone]

## 2013-08-17 NOTE — Progress Notes (Signed)
Patient ID: [name] [name], male   DOB: 03/06/1931, 80 y.o.   MRN: [id number] Left hip and distal lateral thigh staples ready to be removed.  trached with some coughing from secretions.   Hip incision no drainage.

## 2013-08-17 NOTE — Progress Notes (Signed)
Upon entering patient room, noticed that patient had become disconnected from trach collar.  Sats were dropping.  Placed a new set up on patient, suctioned patient out, and increased FIO2 to 40%.  Sats back at 94%.  RT will continue to monitor.

## 2013-08-17 NOTE — Progress Notes (Signed)
SLP Cancellation Note  Patient Details Name: Dorena DewJerry Whiteaker MRN: [id number] DOB: 12/27/1930   Cancelled treatment:       Reason Eval/Treat Not Completed: Patient at procedure or test/unavailable. Leaving floor for procedure. Will f/u 2/3.   Ferdinand LangoLeah Maira Christon MA, CCC-SLP [phone]    Sadiel Mota Meryl 08/17/2013, 1:48 PM

## 2013-08-18 ENCOUNTER — Other Ambulatory Visit: Payer: Self-pay | Admitting: *Deleted

## 2013-08-18 DIAGNOSIS — Z48812 Encounter for surgical aftercare following surgery on the circulatory system: Secondary | ICD-10-CM

## 2013-08-18 DIAGNOSIS — I7101 Dissection of thoracic aorta: Secondary | ICD-10-CM

## 2013-08-18 DIAGNOSIS — I71019 Dissection of thoracic aorta, unspecified: Secondary | ICD-10-CM

## 2013-08-18 LAB — BASIC METABOLIC PANEL
BUN: 25 mg/dL — ABNORMAL HIGH (ref 6–23)
CO2: 27 meq/L (ref 19–32)
CREATININE: 0.64 mg/dL (ref 0.50–1.35)
Calcium: 7.4 mg/dL — ABNORMAL LOW (ref 8.4–10.5)
Chloride: 104 mEq/L (ref 96–112)
GFR calc Af Amer: >90 mL/min (ref >90)
GFR calc non Af Amer: >90 mL/min (ref >90)
Glucose, Bld: 107 mg/dL — ABNORMAL HIGH (ref 70–99)
Potassium: 3.9 mEq/L (ref 3.7–5.3)
Sodium: 141 mEq/L (ref 137–147)

## 2013-08-18 LAB — CBC
HCT: 31.4 % — ABNORMAL LOW (ref 39.0–52.0)
HEMOGLOBIN: 10.2 g/dL — AB (ref 13.0–17.0)
MCH: 30.1 pg (ref 26.0–34.0)
MCHC: 32.5 g/dL (ref 30.0–36.0)
MCV: 92.6 fL (ref 78.0–100.0)
Platelets: 466 10*3/uL — ABNORMAL HIGH (ref 150–400)
RBC: 3.39 MIL/uL — AB (ref 4.22–5.81)
RDW: 17 % — ABNORMAL HIGH (ref 11.5–15.5)
WBC: 10.4 10*3/uL (ref 4.0–10.5)

## 2013-08-18 LAB — GLUCOSE, CAPILLARY: GLUCOSE-CAPILLARY: 130 mg/dL — AB (ref 70–99)

## 2013-08-18 LAB — CLOSTRIDIUM DIFFICILE BY PCR: Toxigenic C. Difficile by PCR: NEGATIVE

## 2013-08-18 NOTE — Progress Notes (Signed)
SLP Cancellation Note  Patient Details Name: [name] [name] MRN: [id number] DOB: 04/10/1931   Cancelled eval:   Continue to follow for readiness for PMSV - pt continues with thick, copious secretions, prohibitive for PMSV use.  Will follow next date for improvements.        [name] [name], [name] [name] 08/18/2013, 10:53 AM
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
## 2013-08-18 NOTE — Clinical Social Work Note (Signed)
Clinical Social Worker continuing to follow patient and family for support and discharge planning needs.  CSW spoke with patient sons [name] [name]([name] [name]) over the phone to further discuss patient plans at discharge.  [place]  CSW to complete FL2 and initiate search once patient is able to maintain at 28% trach collar.  Patient sons understanding and requested that bed offers be shared with all children once available.  CSW remains available for support and to facilitate patient discharge needs once medically stable.  Macario GoldsJesse Chinonso Linker, [place] [phone]
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
## 2013-08-18 NOTE — Progress Notes (Signed)
Up in chair. Son at the bedside. Breathing easier after thoracentesis yesterday. Fewer secretions. Patient examined and I agree with the assessment and plan  Violeta GelinasBurke Mailee Klaas, MD, MPH, FACS Pager: [phone]  08/18/2013 1:03 PM
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
## 2013-08-19 DIAGNOSIS — J189 Pneumonia, unspecified organism: Secondary | ICD-10-CM | POA: Diagnosis not present

## 2013-08-19 MED ORDER — TRAMADOL HCL 50 MG PO TABS: 50.0000 mg | ORAL_TABLET | Freq: Four times a day (QID) | ORAL | Status: DC | PRN
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
## 2013-08-19 NOTE — Progress Notes (Signed)
OT Cancellation Note  Patient Details Name: [name] [name] MRN: [id number] DOB: 05/14/1931   Cancelled Treatment:    Reason Eval/Treat Not Completed: Other (comment) Pt in process of transferring to floor. Will attempt to see later or in am. [facility] LLCWARD,HILLARY [place]lee [place], OTR/L  [phone] 08/19/2013 08/19/2013, 4:20 PM
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
## 2013-08-19 NOTE — Progress Notes (Signed)
Removed staples in left hand and forarm per orders.

## 2013-08-19 NOTE — Progress Notes (Signed)
Report called, transporting pt via bed to 6N17 with belongings.
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
## 2013-08-19 NOTE — Progress Notes (Signed)
**Note De-Identified  Obfuscation** RT note: tracheal sutures removed; site cleaned and mildly red
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
## 2013-08-19 NOTE — Progress Notes (Signed)
Patient is stable and doing quite well.  Transfer is appropriate.  This patient has been seen and I agree with the findings and treatment plan.  Marta LamasJames O. Gae BonWyatt, III, MD, FACS [phone] (pager) [phone] (direct pager) Trauma Surgeon
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
## 2013-08-20 ENCOUNTER — Inpatient Hospital Stay (HOSPITAL_COMMUNITY): Payer: No Typology Code available for payment source

## 2013-08-20 DIAGNOSIS — R1310 Dysphagia, unspecified: Secondary | ICD-10-CM

## 2013-08-20 DIAGNOSIS — R404 Transient alteration of awareness: Secondary | ICD-10-CM

## 2013-08-20 LAB — BASIC METABOLIC PANEL
BUN: 24 mg/dL — ABNORMAL HIGH (ref 6–23)
CO2: 24 mEq/L (ref 19–32)
Calcium: 7.5 mg/dL — ABNORMAL LOW (ref 8.4–10.5)
Chloride: 106 mEq/L (ref 96–112)
Creatinine, Ser: 0.64 mg/dL (ref 0.50–1.35)
GFR calc Af Amer: >90 mL/min (ref >90)
GFR calc non Af Amer: >90 mL/min (ref >90)
Glucose, Bld: 107 mg/dL — ABNORMAL HIGH (ref 70–99)
Potassium: 3.8 mEq/L (ref 3.7–5.3)
SODIUM: 140 meq/L (ref 137–147)

## 2013-08-20 LAB — CBC
HEMATOCRIT: 32.5 % — AB (ref 39.0–52.0)
Hemoglobin: 10.4 g/dL — ABNORMAL LOW (ref 13.0–17.0)
MCH: 29.5 pg (ref 26.0–34.0)
MCHC: 32 g/dL (ref 30.0–36.0)
MCV: 92.3 fL (ref 78.0–100.0)
Platelets: 424 10*3/uL — ABNORMAL HIGH (ref 150–400)
RBC: 3.52 MIL/uL — AB (ref 4.22–5.81)
RDW: 16.9 % — ABNORMAL HIGH (ref 11.5–15.5)
WBC: 11.2 10*3/uL — ABNORMAL HIGH (ref 4.0–10.5)

## 2013-08-20 MED ORDER — HALOPERIDOL LACTATE 5 MG/ML IJ SOLN
5.0000 mg | Freq: Four times a day (QID) | INTRAMUSCULAR | Status: DC | PRN
  Administered 2013-08-21: 5 mg via INTRAVENOUS
  Filled 2013-08-20: qty 1

## 2013-08-20 MED ORDER — LOPERAMIDE HCL 2 MG PO CAPS
2.0000 mg | ORAL_CAPSULE | ORAL | Status: DC | PRN
  Administered 2013-08-20 – 2013-08-21 (×2): 2 mg via ORAL
  Filled 2013-08-20 (×2): qty 1

## 2013-08-20 NOTE — Clinical Social Work Note (Signed)
Clinical Social Worker met with patient at bedside to offer support and discuss patient needs at discharge.  Patient states that his children have explained to him that he will need SNF placement at discharge.  Patient would prefer to return to his camper but hesitantly agrees to placement in [facility].  CSW questioned on who bed offers should be given to - patient requested that his sons be present in the room when bed offers given.  Patient would like to take part in decision making process with his family.  CSW spoke with patient son Dimitry Holsworth) over the phone who is in agreement to be present in the room at 10am tomorrow to go through available bed offers.  Clair Gulling plans to reach out to [place] and other siblings to notify of plans.  Clinical Social Worker inquired about current substance use.  Patient states that he drinks a beer 2-3 times a month and has no desire to change his current habit.  SBIRT complete.  No resources provided at this time.  CSW remains available for support and to facilitate patient discharge needs once medically stable.  Barbette Or, [place]
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
## 2013-08-20 NOTE — Progress Notes (Signed)
Trach changed per MD. Changed to #6CFS. NO complications noted and sats stable through out. Placement verified by BBS and ETCO2 was positive. Pt tolerated well. Sat 96%, HR 92 on 28% ATC.
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
## 2013-08-20 NOTE — Progress Notes (Signed)
PT Cancellation Note  Patient Details Name: Dorena DewJerry Demicco MRN: [id number] DOB: 11/07/1930   Cancelled Treatment:    Reason Eval/Treat Not Completed: Patient at procedure or test/unavailable. Will follow.   Ralene BatheUhlenberg, Zyniah Ferraiolo Kistler 08/20/2013, 11:46 AM [phone]

## 2013-08-20 NOTE — Progress Notes (Signed)
Undergoing F/U swallow eval with ST. Progressing. Patient examined and I agree with the assessment and plan  Violeta GelinasBurke Torrence Branagan, MD, MPH, FACS Pager: [phone]  08/20/2013 11:34 AM
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
## 2013-08-21 DIAGNOSIS — R197 Diarrhea, unspecified: Secondary | ICD-10-CM

## 2013-08-21 DIAGNOSIS — R1310 Dysphagia, unspecified: Secondary | ICD-10-CM

## 2013-08-21 DIAGNOSIS — R404 Transient alteration of awareness: Secondary | ICD-10-CM

## 2013-08-21 LAB — URINALYSIS, ROUTINE W REFLEX MICROSCOPIC
BILIRUBIN URINE: NEGATIVE
Glucose, UA: NEGATIVE mg/dL
Hgb urine dipstick: NEGATIVE
KETONES UR: NEGATIVE mg/dL
LEUKOCYTES UA: NEGATIVE
NITRITE: NEGATIVE
PROTEIN: NEGATIVE mg/dL
Specific Gravity, Urine: 1.01 (ref 1.005–1.030)
Urobilinogen, UA: 0.2 mg/dL (ref 0.0–1.0)
pH: 7 (ref 5.0–8.0)

## 2013-08-21 LAB — BODY FLUID CULTURE
Culture: NO GROWTH
Gram Stain: NONE SEEN

## 2013-08-21 MED ORDER — VITAL HIGH PROTEIN PO LIQD
1000.0000 mL | ORAL | Status: DC
  Administered 2013-08-21 – 2013-08-22 (×2): 1000 mL
  Filled 2013-08-21 (×3): qty 1000

## 2013-08-21 MED ORDER — HALOPERIDOL LACTATE 2 MG/ML PO CONC
2.0000 mg | Freq: Four times a day (QID) | ORAL | Status: DC | PRN
  Administered 2013-08-21: 2 mg
  Filled 2013-08-21 (×2): qty 1

## 2013-08-21 MED ORDER — TAMSULOSIN HCL 0.4 MG PO CAPS
0.4000 mg | ORAL_CAPSULE | Freq: Every day | ORAL | Status: DC
  Administered 2013-08-21 – 2013-08-24 (×4): 0.4 mg via ORAL
  Filled 2013-08-21 (×6): qty 1

## 2013-08-21 MED ORDER — ENSURE COMPLETE PO LIQD
237.0000 mL | Freq: Two times a day (BID) | ORAL | Status: DC
  Administered 2013-08-22 – 2013-08-23 (×4): 237 mL via ORAL

## 2013-08-21 MED ORDER — SACCHAROMYCES BOULARDII 250 MG PO CAPS
250.0000 mg | ORAL_CAPSULE | Freq: Two times a day (BID) | ORAL | Status: DC
  Administered 2013-08-21 – 2013-08-24 (×7): 250 mg via ORAL
  Filled 2013-08-21 (×8): qty 1
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
## 2013-08-21 NOTE — Clinical Social Work Note (Signed)
Clinical Social Worker continuing to follow patient and family for support and discharge planning needs.  CSW met with patient and patient sons at beds to further discuss discharge plans.  Patient and sons have agreed to [facility].  Per treatment team, patient will be ready for discharge on Monday (02/09).  Patient has completed paperwork with personal attorney for power of attorney completion.  Patient power of attorney is now [employer] - awaiting official documents.  CSW notified facility of patient acceptance to bed offer and anticipated discharge date.  CSW remains available for support and to facilitate patient discharge needs once medically stable.  Barbette Or, [place]

## 2013-08-21 NOTE — Progress Notes (Signed)
Agree with dietetic intern note. RD will continue to monitor pt closely.   Ian Malkineanne Barnett RD, LDN Inpatient Clinical Dietitian Pager: [phone] After Hours Pager: [phone]

## 2013-08-21 NOTE — Clinical Social Work Placement (Addendum)
Clinical Social Work Department CLINICAL SOCIAL WORK PLACEMENT NOTE 08/18/2013  Patient:  [name] [name],[name] [name]  Account Number:  [id number] Admit date:  07/27/2013  Clinical Social Worker:  Macario GoldsJESSE Severiano Utsey, LCSW  Date/time:  08/18/2013 11:00 AM  Clinical Social Work is seeking post-discharge placement for this patient at the following level of care:   SKILLED NURSING   (*CSW will update this form in Epic as items are completed)   08/17/2013  Patient/family provided with [facility] of Clinical Social Work's list of facilities offering this level of care within the geographic area requested by the patient (or if unable, by the patient's family).  08/17/2013  Patient/family informed of their freedom to choose among providers that offer the needed level of care, that participate in Medicare, Medicaid or managed care program needed by the patient, have an available bed and are willing to accept the patient.  08/17/2013  Patient/family informed of [facility]' ownership interest in [facility], as well as of the fact that they are under no obligation to receive care at this facility.  PASARR submitted to EDS on 08/19/2013 PASARR number received from EDS on 08/19/2013  FL2 transmitted to all facilities in geographic area requested by pt/family on  08/19/2013 FL2 transmitted to all facilities within larger geographic area on   Patient informed that his/her managed care company has contracts with or will negotiate with  certain facilities, including the following:     Patient/family informed of bed offers received:  08/21/2013 [facility] [place] Physician recommends and patient chooses bed at    [facility] [place] [facility]  08/24/2013 [facility]  The following physician request were entered in Epic:   Additional Comments: 08/21/13 Patient with restraints over  night requiring Haldol
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
## 2013-08-21 NOTE — Progress Notes (Signed)
Pt sleeping with PMSV on.  RT removed.  Pt still resting comfortably.

## 2013-08-21 NOTE — Progress Notes (Signed)
Awaiting placement  This patient has been seen and I agree with the findings and treatment plan.  Precious Segall O. Rocsi Hazelbaker, III, MD, FACS [phone] (pager) [phone] (direct pager) Trauma Surgeon 

## 2013-08-22 DIAGNOSIS — T07XXXA Unspecified multiple injuries, initial encounter: Secondary | ICD-10-CM

## 2013-08-22 MED ORDER — PANTOPRAZOLE SODIUM 40 MG PO TBEC
40.0000 mg | DELAYED_RELEASE_TABLET | Freq: Every day | ORAL | Status: DC
  Administered 2013-08-23 – 2013-08-24 (×2): 40 mg via ORAL
  Filled 2013-08-22 (×2): qty 1

## 2013-08-22 MED ORDER — HALOPERIDOL 2 MG PO TABS
2.0000 mg | ORAL_TABLET | Freq: Four times a day (QID) | ORAL | Status: DC | PRN
  Administered 2013-08-22: 2 mg via ORAL
  Filled 2013-08-22 (×2): qty 1

## 2013-08-22 MED ORDER — GUAIFENESIN 100 MG/5ML PO SYRP
400.0000 mg | ORAL_SOLUTION | ORAL | Status: DC
  Administered 2013-08-22 – 2013-08-24 (×12): 400 mg via ORAL
  Filled 2013-08-22 (×19): qty 20

## 2013-08-22 MED ORDER — TRAMADOL HCL 50 MG PO TABS: 50.0000 mg | ORAL_TABLET | Freq: Four times a day (QID) | ORAL | Status: DC | PRN

## 2013-08-22 NOTE — Progress Notes (Signed)
Patient pulled flexiseal out. MD notified and orders received.
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
## 2013-08-22 NOTE — Procedures (Signed)
Tracheostomy Removal Note  Patient Details:   Name: Dorena DewJerry Rosano DOB: 09/02/1930 MRN: [id number]    Airway Documentation: 7.0 uncuffed Shiley removed per DR. Wyatt order, RN/family @ bedside, no complications, covered with dry sterile dressing and anchored with pink 1" tape, rr-18, 99% room air, RT to monitor.     Evaluation  O2 sats: stable throughout Complications: No apparent complications Patient did tolerate procedure well. Bilateral Breath Sounds: Clear  Joylene JohnSweeney, Chelcy Bolda Mitchell 08/22/2013, 11:53

## 2013-08-22 NOTE — Progress Notes (Signed)
Called to floor-pts. Trach found out, 6.0 cuffless,  RN replaced Trach prior to my arrival, (works @ [employer], has Insurance underwriterTrach experience), 98% on room air b/l b.s. present with no SubQ air noted on palp., placed EtC02 cap on, (retrieved from RT dept.PTA) Trach care done, no blood noted s/p sx.'ing. b/u equipment in room, replaced 28%/5L ATC, RT to monitor.

## 2013-08-23 NOTE — Progress Notes (Signed)
12 Days Post-Op  Subjective: Patient doing well without trach Large UOP with Foley in place Feeling well  Objective: Vital signs in last 24 hours: Temp:  [97.2 F (36.2 C)-97.8 F (36.6 C)] 97.4 F (36.3 C) (02/08 0454) Pulse Rate:  [100] 100 (02/08 0454) Resp:  [19-20] 20 (02/08 0454) BP: (109-146)/(61-66) 145/64 mmHg (02/08 0454) SpO2:  [99 %-100 %] 100 % (02/08 0837) Weight:  [130 lb 15.3 oz (59.4 kg)] 130 lb 15.3 oz (59.4 kg) (02/08 0454) Last BM Date: 08/22/13  Intake/Output from previous day: 02/07 0701 - 02/08 0700 In: 1336 [P.O.:480; I.V.:10; NG/GT:796] Out: 2425 [Urine:2325; Stool:100] Intake/Output this shift:    General: No acute distress.  Has foley in place now with clear yellow urine Neck - trach site ok Lungs: Clear. Coughs well without distress  Abd: Soft, benign and tolerating oral diet well  Extremities: No changes  Neuro: Intact   Lab Results:  No results found for this basename: WBC, HGB, HCT, PLT,  in the last 72 hours BMET No results found for this basename: NA, K, CL, CO2, GLUCOSE, BUN, CREATININE, CALCIUM,  in the last 72 hours PT/INR No results found for this basename: LABPROT, INR,  in the last 72 hours ABG No results found for this basename: PHART, PCO2, PO2, HCO3,  in the last 72 hours  Studies/Results: No results found.  Anti-infectives: Anti-infectives   Start     Dose/Rate Route Frequency Ordered Stop   08/15/13 1100  piperacillin-tazobactam (ZOSYN) IVPB 3.375 g  Status:  Discontinued     3.375 g 12.5 mL/hr over 240 Minutes Intravenous Every 8 hours 08/15/13 1030 08/17/13 1018   08/15/13 1030  ciprofloxacin (CIPRO) IVPB 400 mg     400 mg 200 mL/hr over 60 Minutes Intravenous Every 12 hours 08/15/13 0956 08/21/13 2248   08/06/13 1100  vancomycin (VANCOCIN) IVPB 1000 mg/200 mL premix     1,000 mg 200 mL/hr over 60 Minutes Intravenous Every 12 hours 08/06/13 1032 08/15/13 1138   07/29/13 1300  ceFAZolin (ANCEF) IVPB 2 g/50 mL  premix  Status:  Discontinued     2 g 100 mL/hr over 30 Minutes Intravenous  Once 07/29/13 1249 07/29/13 1450   07/28/13 1400  ceFAZolin (ANCEF) IVPB 2 g/50 mL premix     2 g 100 mL/hr over 30 Minutes Intravenous  Once 07/28/13 1356 07/28/13 1810   07/27/13 1617  ceFAZolin (ANCEF) 2 g in dextrose 5 % 50 mL IVPB     2 g 140 mL/hr over 30 Minutes Intravenous  Once 07/27/13 1618 07/27/13 1856      Assessment/Plan: s/p Procedure(s) with comments: PERCUTANEOUS TRACHEOSTOMY (N/A) - BESIDE TRACH Plan for discharge tomorrow  LOS: 27 days    [name] Fialkowski K. 08/23/2013

## 2013-08-23 NOTE — Progress Notes (Signed)
Follow up to Trach removal 08/22/2013, no free air noted around site, dressing has been changed, rr-18, p-100, room air sats 100%, b/l b.s. clear t/o, no c/o, vocalizing better this date, RT to monitor.

## 2013-08-24 MED ORDER — SACCHAROMYCES BOULARDII 250 MG PO CAPS: 250.0000 mg | ORAL_CAPSULE | Freq: Two times a day (BID) | ORAL | Status: DC

## 2013-08-24 MED ORDER — ASPIRIN EC 325 MG PO TBEC: 325.0000 mg | DELAYED_RELEASE_TABLET | Freq: Every day | ORAL | Status: DC

## 2013-08-24 MED ORDER — LOPERAMIDE HCL 2 MG PO CAPS: 2.0000 mg | ORAL_CAPSULE | ORAL | Status: DC | PRN

## 2013-08-24 MED ORDER — ENSURE COMPLETE PO LIQD: 237.0000 mL | Freq: Two times a day (BID) | ORAL | Status: DC

## 2013-08-24 NOTE — Clinical Social Work Note (Signed)
Clinical Social Worker facilitated patient discharge including contacting patient family and facility to confirm patient discharge plans.  Clinical information faxed to facility and family agreeable with plan.  CSW arranged ambulance transport via [facility] to [facility].  RN to call report prior to discharge.  Clinical Social Worker will sign off for now as social work intervention is no longer needed. Please consult us again if new need arises.  Jesse Calina Patrie, LCSW [phone] 

## 2013-08-24 NOTE — Progress Notes (Signed)
[facility]. Daughter at bedside, DC with foley catheter, [facility]
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
## 2013-08-25 ENCOUNTER — Encounter: Payer: Self-pay | Admitting: Internal Medicine

## 2013-08-25 ENCOUNTER — Non-Acute Institutional Stay (SKILLED_NURSING_FACILITY): Payer: Medicare Other | Admitting: Internal Medicine

## 2013-08-25 DIAGNOSIS — S0291XS Unspecified fracture of skull, sequela: Secondary | ICD-10-CM

## 2013-08-25 DIAGNOSIS — S36115S Moderate laceration of liver, sequela: Secondary | ICD-10-CM

## 2013-08-25 DIAGNOSIS — S32602S Unspecified fracture of left ischium, sequela: Secondary | ICD-10-CM

## 2013-08-25 DIAGNOSIS — S37812S Contusion of adrenal gland, sequela: Secondary | ICD-10-CM

## 2013-08-25 DIAGNOSIS — D62 Acute posthemorrhagic anemia: Secondary | ICD-10-CM

## 2013-08-25 DIAGNOSIS — S0285XS Fracture of orbit, unspecified, sequela: Secondary | ICD-10-CM

## 2013-08-25 DIAGNOSIS — S2242XS Multiple fractures of ribs, left side, sequela: Secondary | ICD-10-CM

## 2013-08-25 DIAGNOSIS — S3210XS Unspecified fracture of sacrum, sequela: Secondary | ICD-10-CM

## 2013-08-25 DIAGNOSIS — S0292XS Unspecified fracture of facial bones, sequela: Secondary | ICD-10-CM

## 2013-08-25 DIAGNOSIS — J9601 Acute respiratory failure with hypoxia: Secondary | ICD-10-CM

## 2013-08-25 DIAGNOSIS — IMO0002 Reserved for concepts with insufficient information to code with codable children: Secondary | ICD-10-CM

## 2013-08-25 DIAGNOSIS — S72142S Displaced intertrochanteric fracture of left femur, sequela: Secondary | ICD-10-CM

## 2013-08-25 DIAGNOSIS — D696 Thrombocytopenia, unspecified: Secondary | ICD-10-CM

## 2013-08-25 DIAGNOSIS — S3690XS Unspecified injury of unspecified intra-abdominal organ, sequela: Secondary | ICD-10-CM

## 2013-08-25 DIAGNOSIS — S32592S Other specified fracture of left pubis, sequela: Secondary | ICD-10-CM

## 2013-08-25 DIAGNOSIS — S01312S Laceration without foreign body of left ear, sequela: Secondary | ICD-10-CM

## 2013-08-25 DIAGNOSIS — J189 Pneumonia, unspecified organism: Secondary | ICD-10-CM

## 2013-08-25 DIAGNOSIS — S32591S Other specified fracture of right pubis, sequela: Secondary | ICD-10-CM

## 2013-08-25 DIAGNOSIS — S72009S Fracture of unspecified part of neck of unspecified femur, sequela: Secondary | ICD-10-CM

## 2013-08-25 DIAGNOSIS — J9600 Acute respiratory failure, unspecified whether with hypoxia or hypercapnia: Secondary | ICD-10-CM
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
## 2013-09-04 ENCOUNTER — Non-Acute Institutional Stay (SKILLED_NURSING_FACILITY): Payer: Medicare Other | Admitting: Internal Medicine

## 2013-09-04 DIAGNOSIS — J9502 Infection of tracheostomy stoma: Secondary | ICD-10-CM

## 2013-09-04 NOTE — Progress Notes (Signed)
Patient ID: Dorena DewJerry Cuppett, male   DOB: 08/27/1930, 80 y.o.   MRN: [id number]    [facility]  Chief Complaint  Patient presents with  . Acute Visit    secretions from trach site    HPI 80 y/o male pt is seen for yellowish drainage at trach site. He is s/p extubation and tracheostomy (now trach has been removed) for respiratory failure and is here for STR. He is seen in his room. He is lying in bed and in no distress. He is on room air, breathing is stable. Has some cough, no URI symptoms, no chest pain.has been working with therapy team. No fever or chills  ROS Improving strength by working with therapy team No fever or chills No nausea or vomiting No abdominal pain No dyspnea at rest, has some at exertion  Past Medical History  Diagnosis Date  . Rib fractures    Medication reviewed. See MAR  Physical exam Vitals stable, afebrile  General- elderly male in no acute distress, thin built Head- atraumatic, normocephalic Eyes- PERRLA, EOMI, no pallor, no icterus Neck- no lymphadenopathy, trach site healing well, has seroanginous drainage, no erythema cvs- normal s1,s2, rrr Respiratory- bilateral poor inspiratory effort, clear to auscultation, no wheeze, no rhonchi Abdomen- bowel sounds present, soft, non tender Musculoskeletal- able to move all 4 extremities Psychiatry- alert and oriented to person, place and time, normal mood and affect  Assessment/plan Tracheostomy site infection- bacitracin ointment bid x 5 days after cleaning area with moistened gauze, reassess if no improvement. Monitor for breathing symptoms and change in color of drainage/ erythema or fever

## 2013-09-10 ENCOUNTER — Non-Acute Institutional Stay (SKILLED_NURSING_FACILITY): Payer: Medicare Other | Admitting: Internal Medicine

## 2013-09-10 ENCOUNTER — Encounter: Payer: Self-pay | Admitting: Internal Medicine

## 2013-09-10 DIAGNOSIS — A499 Bacterial infection, unspecified: Secondary | ICD-10-CM

## 2013-09-10 DIAGNOSIS — B9689 Other specified bacterial agents as the cause of diseases classified elsewhere: Secondary | ICD-10-CM | POA: Insufficient documentation

## 2013-09-10 DIAGNOSIS — L089 Local infection of the skin and subcutaneous tissue, unspecified: Secondary | ICD-10-CM

## 2013-09-10 DIAGNOSIS — R634 Abnormal weight loss: Secondary | ICD-10-CM | POA: Insufficient documentation

## 2013-09-10 NOTE — Progress Notes (Signed)
Patient ID: [name] [name], male   DOB: 12/25/1930, 80 y.o.   MRN: [id number]     [facility]  Chief Complaint  Patient presents with  . Acute Visit    green drainage at trach site, weight loss   No Known Allergies  HPI 80 y/o male pt here for STR is seen due to continuing drainage from trach removal site. It is greenish discharge with erythema around the stoma area. Remains afebrile. Has been losing weight and has poor oral intake. He denies any complaints.   ROS Improving strength by working with therapy team No fever or chills No nausea or vomiting No abdominal pain No dyspnea at rest, has some at exertion    Past Medical History  Diagnosis Date  . Rib fractures     Current Outpatient Prescriptions on File Prior to Visit  Medication Sig Dispense Refill  . aspirin EC 325 MG tablet Take 1 tablet (325 mg total) by mouth daily.      . feeding supplement, ENSURE COMPLETE, (ENSURE COMPLETE) LIQD Take 237 mLs by mouth 2 (two) times daily between meals.      Marland Kitchen. loperamide (IMODIUM) 2 MG capsule Take 1 capsule (2 mg total) by mouth as needed for diarrhea or loose stools.      Marland Kitchen. OVER THE COUNTER MEDICATION Take 1 tablet by mouth daily as needed (for ringing in ear). Medication for ringing in ear      . saccharomyces boulardii (FLORASTOR) 250 MG capsule Take 1 capsule (250 mg total) by mouth 2 (two) times daily.       No current facility-administered medications on file prior to visit.    Physical exam BP 131/76  Pulse 67  Temp(Src) 97.3 F (36.3 C)  Resp 18  Wt 114 lb (51.71 kg)  General- elderly male in no acute distress, thin built Head- atraumatic, normocephalic Eyes- PERRLA, EOMI, no pallor, no icterus Neck- no lymphadenopathy, trach site healing well, has purulent drainage with erythema in the skin around cvs- normal s1,s2, rrr Respiratory- bilateral poor inspiratory effort, clear to auscultation, no wheeze, no rhonchi Abdomen- bowel sounds present,  soft, non tender Musculoskeletal- able to move all 4 extremities Psychiatry- alert and oriented to person, place and time, normal mood and affect  Assessment/plan Tracheostomy site infection-  Doxycycline 100 mg q12h for 5 days- reassess if no improvement. Dry dressing decubi-vite 1 capsule daily  Weight loss- remeron 7.5 mg daily to help with appetite Will have him on nutritional supplement to help with wound healing and weight gain

## 2013-09-23 ENCOUNTER — Ambulatory Visit (INDEPENDENT_AMBULATORY_CARE_PROVIDER_SITE_OTHER): Payer: Medicare Other | Admitting: Orthopedic Surgery

## 2013-09-23 ENCOUNTER — Encounter (INDEPENDENT_AMBULATORY_CARE_PROVIDER_SITE_OTHER): Payer: Self-pay

## 2013-09-23 VITALS — BP 102/60 | HR 78 | Temp 97.9°F | Resp 14 | Ht 66.0 in

## 2013-09-23 DIAGNOSIS — Z4659 Encounter for fitting and adjustment of other gastrointestinal appliance and device: Secondary | ICD-10-CM

## 2013-09-23 DIAGNOSIS — R1310 Dysphagia, unspecified: Secondary | ICD-10-CM
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
## 2013-10-05 DIAGNOSIS — S37009A Unspecified injury of unspecified kidney, initial encounter: Secondary | ICD-10-CM

## 2013-10-05 DIAGNOSIS — IMO0001 Reserved for inherently not codable concepts without codable children: Secondary | ICD-10-CM

## 2013-10-05 DIAGNOSIS — R1312 Dysphagia, oropharyngeal phase: Secondary | ICD-10-CM

## 2013-10-05 DIAGNOSIS — D500 Iron deficiency anemia secondary to blood loss (chronic): Secondary | ICD-10-CM

## 2013-11-18 ENCOUNTER — Ambulatory Visit (INDEPENDENT_AMBULATORY_CARE_PROVIDER_SITE_OTHER): Payer: Medicare Other | Admitting: Diagnostic Neuroimaging

## 2013-11-18 ENCOUNTER — Encounter: Payer: Self-pay | Admitting: Diagnostic Neuroimaging

## 2013-11-18 VITALS — BP 122/69 | HR 75 | Ht 66.5 in | Wt 120.5 lb

## 2013-11-18 DIAGNOSIS — R413 Other amnesia: Secondary | ICD-10-CM

## 2013-11-18 DIAGNOSIS — S069XAA Unspecified intracranial injury with loss of consciousness status unknown, initial encounter: Secondary | ICD-10-CM

## 2013-11-18 DIAGNOSIS — F03A0 Unspecified dementia, mild, without behavioral disturbance, psychotic disturbance, mood disturbance, and anxiety: Secondary | ICD-10-CM

## 2013-11-18 DIAGNOSIS — S069X9A Unspecified intracranial injury with loss of consciousness of unspecified duration, initial encounter: Secondary | ICD-10-CM

## 2013-11-18 DIAGNOSIS — R404 Transient alteration of awareness: Secondary | ICD-10-CM

## 2013-11-18 DIAGNOSIS — F1011 Alcohol abuse, in remission: Secondary | ICD-10-CM

## 2013-11-18 DIAGNOSIS — F0390 Unspecified dementia without behavioral disturbance: Secondary | ICD-10-CM

## 2013-11-18 NOTE — Patient Instructions (Signed)
Continue namenda.  I will check MRI, EEG and lab testing.
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
## 2013-11-19 ENCOUNTER — Telehealth: Payer: Self-pay | Admitting: Diagnostic Neuroimaging

## 2013-11-19 NOTE — Telephone Encounter (Signed)
Left message for patient to call and schedule EEG per Dr. Marjory LiesPenumalli.

## 2013-12-04 ENCOUNTER — Ambulatory Visit
Admission: RE | Admit: 2013-12-04 | Discharge: 2013-12-04 | Disposition: A | Discharge status: Home / Self Care | Payer: Medicare Other | Source: Ambulatory Visit | Attending: Diagnostic Neuroimaging | Admitting: Diagnostic Neuroimaging

## 2013-12-04 DIAGNOSIS — F0390 Unspecified dementia without behavioral disturbance: Secondary | ICD-10-CM

## 2013-12-04 DIAGNOSIS — S069X9A Unspecified intracranial injury with loss of consciousness of unspecified duration, initial encounter: Secondary | ICD-10-CM

## 2013-12-04 DIAGNOSIS — R413 Other amnesia: Secondary | ICD-10-CM

## 2013-12-04 DIAGNOSIS — S069XAA Unspecified intracranial injury with loss of consciousness status unknown, initial encounter: Secondary | ICD-10-CM

## 2013-12-04 DIAGNOSIS — F1011 Alcohol abuse, in remission: Secondary | ICD-10-CM

## 2013-12-04 DIAGNOSIS — R404 Transient alteration of awareness: Secondary | ICD-10-CM

## 2013-12-04 DIAGNOSIS — F03A0 Unspecified dementia, mild, without behavioral disturbance, psychotic disturbance, mood disturbance, and anxiety: Secondary | ICD-10-CM

## 2013-12-09 ENCOUNTER — Telehealth: Payer: Self-pay | Admitting: Family Medicine

## 2013-12-09 NOTE — Telephone Encounter (Signed)
Calling about the [facility] that needs to be completed?  RN left a message.

## 2013-12-09 NOTE — Telephone Encounter (Signed)
Explained to daughter that he is not Dr Nelida Meuse patient

## 2013-12-14 ENCOUNTER — Ambulatory Visit
Admission: RE | Admit: 2013-12-14 | Discharge: 2013-12-14 | Disposition: A | Discharge status: Home / Self Care | Payer: Medicare Other | Source: Ambulatory Visit | Attending: Diagnostic Neuroimaging | Admitting: Diagnostic Neuroimaging

## 2013-12-14 ENCOUNTER — Other Ambulatory Visit: Payer: Self-pay | Admitting: Diagnostic Neuroimaging

## 2013-12-14 DIAGNOSIS — F0390 Unspecified dementia without behavioral disturbance: Secondary | ICD-10-CM

## 2013-12-14 DIAGNOSIS — R404 Transient alteration of awareness: Secondary | ICD-10-CM

## 2013-12-14 DIAGNOSIS — F1011 Alcohol abuse, in remission: Secondary | ICD-10-CM

## 2013-12-14 DIAGNOSIS — F03A0 Unspecified dementia, mild, without behavioral disturbance, psychotic disturbance, mood disturbance, and anxiety: Secondary | ICD-10-CM

## 2013-12-14 DIAGNOSIS — S069XAA Unspecified intracranial injury with loss of consciousness status unknown, initial encounter: Secondary | ICD-10-CM

## 2013-12-14 DIAGNOSIS — R413 Other amnesia: Secondary | ICD-10-CM

## 2013-12-14 DIAGNOSIS — S069X9A Unspecified intracranial injury with loss of consciousness of unspecified duration, initial encounter: Secondary | ICD-10-CM

## 2013-12-16 ENCOUNTER — Telehealth: Payer: Self-pay | Admitting: Diagnostic Neuroimaging

## 2013-12-16 NOTE — Telephone Encounter (Signed)
Pt's daughter calling requesting pt's MRI results. Dr. Marjory Lies is not in the office, sending information to [facility], Dr. Terrace Arabia, please advise

## 2013-12-16 NOTE — Telephone Encounter (Signed)
Daughter calling to get MRI results.  Please return call.

## 2013-12-17 NOTE — Telephone Encounter (Signed)
I fail to reach his daughter, left message,  Please call again, MRI of the brain showed age related changes, no acute lesions,  ---------------  Abnormal MRI scan of the brain showing mild generalized atrophy and changes of chronic microvascular ischemia.

## 2013-12-18 NOTE — Telephone Encounter (Signed)
Patient's daughter is returning call-please call.

## 2013-12-18 NOTE — Telephone Encounter (Signed)
Called pt's daughter Melodie to inform her per Dr. Terrace Arabia that the pt's MRI of the brain shoed age related changes and no acute lesions. Daughter stated that the pt did not get MRI with Contrast because pt would not be still and she thought that Dr. Marjory Lies ordered MRI w and w/o contrast. I explained to the daughter that Dr. Marjory Lies was out of the office until next week and daughter stated that she would like for Dr. Marjory Lies to give her a call when he gets back. Please advise

## 2013-12-23 NOTE — Telephone Encounter (Signed)
Daughter calling back requesting a return call.  Questioning if MRI was ordered with and w/o contrast.  Please call and advise

## 2013-12-23 NOTE — Telephone Encounter (Signed)
I reviewed MRI. Ok to leave as is. Contrast not needed at this time. -VRP

## 2013-12-23 NOTE — Telephone Encounter (Signed)
I spoke to daughter, Dahlia Client.  She would like to have MRI done with contrast.  (but feels like the pt needs sedation).  He cannot be still due to dementia.  I would check with Dr.Penumalli on his recommendations about the results of MRI wo contrast and go from there.  He would have to have at [facility] for sedation. Can have EEG and then have labs done.   (daughter feels like if she can be with him that he will do ok re: being still).

## 2013-12-24 ENCOUNTER — Other Ambulatory Visit: Payer: Medicare Other | Admitting: Radiology

## 2013-12-24 ENCOUNTER — Telehealth: Payer: Self-pay | Admitting: Diagnostic Neuroimaging

## 2013-12-24 NOTE — Telephone Encounter (Signed)
I called daughter and relayed that MRI contrast not needed at this time per Dr. Marjory Lies.  EEG to be scheduled then have labs too.

## 2013-12-24 NOTE — Telephone Encounter (Signed)
Left message for patient's daughter to return call and schedule EEG per Dr Marjory Lies.

## 2013-12-28 ENCOUNTER — Ambulatory Visit (INDEPENDENT_AMBULATORY_CARE_PROVIDER_SITE_OTHER): Payer: Medicare Other | Admitting: Radiology

## 2013-12-28 DIAGNOSIS — F0390 Unspecified dementia without behavioral disturbance: Secondary | ICD-10-CM

## 2013-12-28 DIAGNOSIS — F1011 Alcohol abuse, in remission: Secondary | ICD-10-CM

## 2013-12-28 DIAGNOSIS — R413 Other amnesia: Secondary | ICD-10-CM

## 2013-12-28 DIAGNOSIS — S069X0S Unspecified intracranial injury without loss of consciousness, sequela: Secondary | ICD-10-CM

## 2013-12-28 DIAGNOSIS — F03A0 Unspecified dementia, mild, without behavioral disturbance, psychotic disturbance, mood disturbance, and anxiety: Secondary | ICD-10-CM

## 2013-12-28 DIAGNOSIS — R404 Transient alteration of awareness: Secondary | ICD-10-CM

## 2013-12-29 ENCOUNTER — Telehealth: Payer: Self-pay | Admitting: *Deleted

## 2013-12-29 NOTE — Telephone Encounter (Signed)
Called pt and spoke with pt's son Rosanne AshingJim informing him that the EEG results is not in yet and that the pt's lab results are not in yet either and if he could please find out where the pt had the lab drawn and have them sent our office the results. I advised the son that if the pt has any other problems, questions or concerns to call the office. Son verbalized understanding.

## 2014-01-05 NOTE — Procedures (Signed)
   [facility]  EEG (ELECTROENCEPHALOGRAM) REPORT   STUDY DATE: 01/05/14 PATIENT NAME: [name] [name] DOB: 04/09/1931 MRN: [id number]  ORDERING CLINICIAN: Joycelyn SchmidVikram Penumalli, MD   TECHNOLOGIST: Kaylyn LimSue Fox TECHNIQUE: Electroencephalogram was recorded utilizing standard 10-20 system of lead placement and reformatted into average and bipolar montages.  RECORDING TIME: 30 minutes ACTIVATION: photic stimulation  CLINICAL INFORMATION: 80 year old male with memory difficulties and intermittent staring spells.   FINDINGS: Background rhythms of 10-11 hertz and 15-20 microvolts. No focal, lateralizing, epileptiform activity or seizures are seen. Patient recorded in the awake and asleep state. EKG channel shows 90 beats per minute.  IMPRESSION:  Normal EEG in the awake and asleep states.   INTERPRETING PHYSICIAN:  Suanne MarkerVIKRAM R. PENUMALLI, MD Certified in Neurology, Neurophysiology and Neuroimaging  [facility] [address], [address] [place], [place] [address] [phone]

## 2014-02-16 ENCOUNTER — Other Ambulatory Visit: Payer: Medicare Other

## 2014-02-16 ENCOUNTER — Ambulatory Visit: Payer: Medicare Other | Admitting: Vascular Surgery

## 2014-02-26 ENCOUNTER — Other Ambulatory Visit: Payer: Self-pay | Admitting: Vascular Surgery

## 2014-02-26 LAB — CREATININE, SERUM: Creat: 0.77 mg/dL (ref 0.50–1.35)

## 2014-02-26 LAB — BUN: BUN: 11 mg/dL (ref 6–23)

## 2014-03-01 ENCOUNTER — Encounter: Payer: Self-pay | Admitting: Vascular Surgery

## 2014-03-02 ENCOUNTER — Ambulatory Visit
Admission: RE | Admit: 2014-03-02 | Discharge: 2014-03-02 | Disposition: A | Discharge status: Home / Self Care | Payer: Medicare Other | Source: Ambulatory Visit | Attending: Vascular Surgery | Admitting: Vascular Surgery

## 2014-03-02 ENCOUNTER — Encounter: Payer: Self-pay | Admitting: Vascular Surgery

## 2014-03-02 ENCOUNTER — Ambulatory Visit: Payer: Medicare Other | Admitting: Vascular Surgery

## 2014-03-02 ENCOUNTER — Ambulatory Visit (INDEPENDENT_AMBULATORY_CARE_PROVIDER_SITE_OTHER): Payer: Medicare Other | Admitting: Vascular Surgery

## 2014-03-02 VITALS — BP 138/76 | HR 69 | Resp 16 | Ht 66.0 in | Wt 120.0 lb

## 2014-03-02 DIAGNOSIS — Z09 Encounter for follow-up examination after completed treatment for conditions other than malignant neoplasm: Secondary | ICD-10-CM

## 2014-03-02 DIAGNOSIS — S2500XA Unspecified injury of thoracic aorta, initial encounter: Secondary | ICD-10-CM

## 2014-03-02 DIAGNOSIS — I71019 Dissection of thoracic aorta, unspecified: Secondary | ICD-10-CM

## 2014-03-02 DIAGNOSIS — I7101 Dissection of thoracic aorta: Secondary | ICD-10-CM

## 2014-03-02 DIAGNOSIS — Z48812 Encounter for surgical aftercare following surgery on the circulatory system: Secondary | ICD-10-CM

## 2014-03-02 DIAGNOSIS — Z5189 Encounter for other specified aftercare: Secondary | ICD-10-CM

## 2014-03-02 DIAGNOSIS — S2500XD Unspecified injury of thoracic aorta, subsequent encounter: Secondary | ICD-10-CM

## 2014-03-02 MED ORDER — IOHEXOL 350 MG/ML SOLN
80.0000 mL | Freq: Once | INTRAVENOUS | Status: AC | PRN
  Administered 2014-03-02: 80 mL via INTRAVENOUS
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
## 2014-03-03 NOTE — Addendum Note (Signed)
Addended by: Malashia Kamaka K on: 03/03/2014 09:44 AM   Modules accepted: Orders  

## 2014-05-17 ENCOUNTER — Telehealth: Payer: Self-pay | Admitting: Diagnostic Neuroimaging

## 2014-05-17 NOTE — Telephone Encounter (Signed)
Patient's son, Fayrene FearingJames has questions regarding patient's condition and diagnosis.  Please call anytime and may leave message on voicemail.

## 2014-05-17 NOTE — Telephone Encounter (Signed)
Spoke to son. He is concerned that his sister who came in with patient at the last visit was giving misinformation or exaggerating patient's condition. Advised son to come with patient to follow up visit. Son said he is out of town currently, but when he gets back in town he will schedule f/u.

## 2014-05-20 ENCOUNTER — Ambulatory Visit: Payer: Medicare Other | Admitting: Diagnostic Neuroimaging

## 2014-07-29 ENCOUNTER — Encounter (HOSPITAL_COMMUNITY): Payer: Self-pay | Admitting: General Surgery

## 2014-09-09 DIAGNOSIS — I10 Essential (primary) hypertension: Secondary | ICD-10-CM | POA: Diagnosis not present

## 2014-09-09 DIAGNOSIS — M6281 Muscle weakness (generalized): Secondary | ICD-10-CM | POA: Diagnosis not present

## 2014-09-09 DIAGNOSIS — R339 Retention of urine, unspecified: Secondary | ICD-10-CM | POA: Diagnosis not present

## 2014-09-10 DIAGNOSIS — I10 Essential (primary) hypertension: Secondary | ICD-10-CM | POA: Diagnosis not present

## 2014-09-13 DIAGNOSIS — M6281 Muscle weakness (generalized): Secondary | ICD-10-CM | POA: Diagnosis not present

## 2014-09-13 DIAGNOSIS — R260 Ataxic gait: Secondary | ICD-10-CM | POA: Diagnosis not present

## 2014-09-13 DIAGNOSIS — I10 Essential (primary) hypertension: Secondary | ICD-10-CM | POA: Diagnosis not present

## 2014-09-13 DIAGNOSIS — R279 Unspecified lack of coordination: Secondary | ICD-10-CM | POA: Diagnosis not present

## 2014-09-13 DIAGNOSIS — S7292XS Unspecified fracture of left femur, sequela: Secondary | ICD-10-CM | POA: Diagnosis not present

## 2014-09-13 DIAGNOSIS — R262 Difficulty in walking, not elsewhere classified: Secondary | ICD-10-CM | POA: Diagnosis not present

## 2014-09-15 DIAGNOSIS — D519 Vitamin B12 deficiency anemia, unspecified: Secondary | ICD-10-CM | POA: Diagnosis not present

## 2014-09-15 DIAGNOSIS — I10 Essential (primary) hypertension: Secondary | ICD-10-CM | POA: Diagnosis not present

## 2014-09-15 DIAGNOSIS — E119 Type 2 diabetes mellitus without complications: Secondary | ICD-10-CM | POA: Diagnosis not present

## 2014-09-15 DIAGNOSIS — E785 Hyperlipidemia, unspecified: Secondary | ICD-10-CM | POA: Diagnosis not present

## 2014-09-15 DIAGNOSIS — E039 Hypothyroidism, unspecified: Secondary | ICD-10-CM | POA: Diagnosis not present

## 2014-09-17 DIAGNOSIS — R260 Ataxic gait: Secondary | ICD-10-CM | POA: Diagnosis not present

## 2014-09-17 DIAGNOSIS — M6281 Muscle weakness (generalized): Secondary | ICD-10-CM | POA: Diagnosis not present

## 2014-09-17 DIAGNOSIS — R338 Other retention of urine: Secondary | ICD-10-CM | POA: Diagnosis not present

## 2014-09-17 DIAGNOSIS — I10 Essential (primary) hypertension: Secondary | ICD-10-CM | POA: Diagnosis not present

## 2014-09-17 DIAGNOSIS — S7292XS Unspecified fracture of left femur, sequela: Secondary | ICD-10-CM | POA: Diagnosis not present

## 2014-09-17 DIAGNOSIS — Z9181 History of falling: Secondary | ICD-10-CM | POA: Diagnosis not present

## 2014-09-17 DIAGNOSIS — M25512 Pain in left shoulder: Secondary | ICD-10-CM | POA: Diagnosis not present

## 2014-09-21 DIAGNOSIS — R260 Ataxic gait: Secondary | ICD-10-CM | POA: Diagnosis not present

## 2014-09-21 DIAGNOSIS — M25512 Pain in left shoulder: Secondary | ICD-10-CM | POA: Diagnosis not present

## 2014-09-21 DIAGNOSIS — M6281 Muscle weakness (generalized): Secondary | ICD-10-CM | POA: Diagnosis not present

## 2014-09-21 DIAGNOSIS — I10 Essential (primary) hypertension: Secondary | ICD-10-CM | POA: Diagnosis not present

## 2014-09-21 DIAGNOSIS — Z9181 History of falling: Secondary | ICD-10-CM | POA: Diagnosis not present

## 2014-09-21 DIAGNOSIS — R338 Other retention of urine: Secondary | ICD-10-CM | POA: Diagnosis not present

## 2014-09-21 DIAGNOSIS — S7292XS Unspecified fracture of left femur, sequela: Secondary | ICD-10-CM | POA: Diagnosis not present

## 2014-09-23 DIAGNOSIS — M25512 Pain in left shoulder: Secondary | ICD-10-CM | POA: Diagnosis not present

## 2014-09-23 DIAGNOSIS — M6281 Muscle weakness (generalized): Secondary | ICD-10-CM | POA: Diagnosis not present

## 2014-09-23 DIAGNOSIS — Z9181 History of falling: Secondary | ICD-10-CM | POA: Diagnosis not present

## 2014-09-23 DIAGNOSIS — R260 Ataxic gait: Secondary | ICD-10-CM | POA: Diagnosis not present

## 2014-09-23 DIAGNOSIS — I10 Essential (primary) hypertension: Secondary | ICD-10-CM | POA: Diagnosis not present

## 2014-09-23 DIAGNOSIS — R338 Other retention of urine: Secondary | ICD-10-CM | POA: Diagnosis not present

## 2014-09-23 DIAGNOSIS — S7292XS Unspecified fracture of left femur, sequela: Secondary | ICD-10-CM | POA: Diagnosis not present

## 2014-09-27 DIAGNOSIS — S7292XS Unspecified fracture of left femur, sequela: Secondary | ICD-10-CM | POA: Diagnosis not present

## 2014-09-27 DIAGNOSIS — R260 Ataxic gait: Secondary | ICD-10-CM | POA: Diagnosis not present

## 2014-09-27 DIAGNOSIS — M6281 Muscle weakness (generalized): Secondary | ICD-10-CM | POA: Diagnosis not present

## 2014-09-27 DIAGNOSIS — R338 Other retention of urine: Secondary | ICD-10-CM | POA: Diagnosis not present

## 2014-09-27 DIAGNOSIS — Z9181 History of falling: Secondary | ICD-10-CM | POA: Diagnosis not present

## 2014-09-27 DIAGNOSIS — I10 Essential (primary) hypertension: Secondary | ICD-10-CM | POA: Diagnosis not present

## 2014-09-27 DIAGNOSIS — M25512 Pain in left shoulder: Secondary | ICD-10-CM | POA: Diagnosis not present

## 2014-09-30 DIAGNOSIS — Z9181 History of falling: Secondary | ICD-10-CM | POA: Diagnosis not present

## 2014-09-30 DIAGNOSIS — R260 Ataxic gait: Secondary | ICD-10-CM | POA: Diagnosis not present

## 2014-09-30 DIAGNOSIS — I10 Essential (primary) hypertension: Secondary | ICD-10-CM | POA: Diagnosis not present

## 2014-09-30 DIAGNOSIS — M6281 Muscle weakness (generalized): Secondary | ICD-10-CM | POA: Diagnosis not present

## 2014-09-30 DIAGNOSIS — M25512 Pain in left shoulder: Secondary | ICD-10-CM | POA: Diagnosis not present

## 2014-09-30 DIAGNOSIS — S7292XS Unspecified fracture of left femur, sequela: Secondary | ICD-10-CM | POA: Diagnosis not present

## 2014-09-30 DIAGNOSIS — R338 Other retention of urine: Secondary | ICD-10-CM | POA: Diagnosis not present

## 2014-10-04 DIAGNOSIS — R338 Other retention of urine: Secondary | ICD-10-CM | POA: Diagnosis not present

## 2014-10-04 DIAGNOSIS — R260 Ataxic gait: Secondary | ICD-10-CM | POA: Diagnosis not present

## 2014-10-04 DIAGNOSIS — I10 Essential (primary) hypertension: Secondary | ICD-10-CM | POA: Diagnosis not present

## 2014-10-04 DIAGNOSIS — M25512 Pain in left shoulder: Secondary | ICD-10-CM | POA: Diagnosis not present

## 2014-10-04 DIAGNOSIS — S7292XS Unspecified fracture of left femur, sequela: Secondary | ICD-10-CM | POA: Diagnosis not present

## 2014-10-04 DIAGNOSIS — M6281 Muscle weakness (generalized): Secondary | ICD-10-CM | POA: Diagnosis not present

## 2014-10-04 DIAGNOSIS — Z9181 History of falling: Secondary | ICD-10-CM | POA: Diagnosis not present

## 2014-10-07 DIAGNOSIS — R338 Other retention of urine: Secondary | ICD-10-CM | POA: Diagnosis not present

## 2014-10-07 DIAGNOSIS — M25512 Pain in left shoulder: Secondary | ICD-10-CM | POA: Diagnosis not present

## 2014-10-07 DIAGNOSIS — S7292XS Unspecified fracture of left femur, sequela: Secondary | ICD-10-CM | POA: Diagnosis not present

## 2014-10-07 DIAGNOSIS — Z9181 History of falling: Secondary | ICD-10-CM | POA: Diagnosis not present

## 2014-10-07 DIAGNOSIS — M6281 Muscle weakness (generalized): Secondary | ICD-10-CM | POA: Diagnosis not present

## 2014-10-07 DIAGNOSIS — I10 Essential (primary) hypertension: Secondary | ICD-10-CM | POA: Diagnosis not present

## 2014-10-07 DIAGNOSIS — R260 Ataxic gait: Secondary | ICD-10-CM | POA: Diagnosis not present

## 2014-10-11 DIAGNOSIS — S7292XS Unspecified fracture of left femur, sequela: Secondary | ICD-10-CM | POA: Diagnosis not present

## 2014-10-11 DIAGNOSIS — M25512 Pain in left shoulder: Secondary | ICD-10-CM | POA: Diagnosis not present

## 2014-10-11 DIAGNOSIS — M6281 Muscle weakness (generalized): Secondary | ICD-10-CM | POA: Diagnosis not present

## 2014-10-11 DIAGNOSIS — R260 Ataxic gait: Secondary | ICD-10-CM | POA: Diagnosis not present

## 2014-10-11 DIAGNOSIS — Z9181 History of falling: Secondary | ICD-10-CM | POA: Diagnosis not present

## 2014-10-11 DIAGNOSIS — I10 Essential (primary) hypertension: Secondary | ICD-10-CM | POA: Diagnosis not present

## 2014-10-11 DIAGNOSIS — R338 Other retention of urine: Secondary | ICD-10-CM | POA: Diagnosis not present

## 2014-10-13 DIAGNOSIS — M25512 Pain in left shoulder: Secondary | ICD-10-CM | POA: Diagnosis not present

## 2014-10-13 DIAGNOSIS — I10 Essential (primary) hypertension: Secondary | ICD-10-CM | POA: Diagnosis not present

## 2014-10-13 DIAGNOSIS — R338 Other retention of urine: Secondary | ICD-10-CM | POA: Diagnosis not present

## 2014-10-13 DIAGNOSIS — R260 Ataxic gait: Secondary | ICD-10-CM | POA: Diagnosis not present

## 2014-10-13 DIAGNOSIS — S7292XS Unspecified fracture of left femur, sequela: Secondary | ICD-10-CM | POA: Diagnosis not present

## 2014-10-13 DIAGNOSIS — M6281 Muscle weakness (generalized): Secondary | ICD-10-CM | POA: Diagnosis not present

## 2014-10-13 DIAGNOSIS — Z9181 History of falling: Secondary | ICD-10-CM | POA: Diagnosis not present

## 2014-10-14 DIAGNOSIS — M6281 Muscle weakness (generalized): Secondary | ICD-10-CM | POA: Diagnosis not present

## 2014-10-14 DIAGNOSIS — R339 Retention of urine, unspecified: Secondary | ICD-10-CM | POA: Diagnosis not present

## 2014-10-14 DIAGNOSIS — E559 Vitamin D deficiency, unspecified: Secondary | ICD-10-CM | POA: Diagnosis not present

## 2014-10-14 DIAGNOSIS — D649 Anemia, unspecified: Secondary | ICD-10-CM | POA: Diagnosis not present

## 2014-10-19 DIAGNOSIS — I10 Essential (primary) hypertension: Secondary | ICD-10-CM | POA: Diagnosis not present

## 2014-10-19 DIAGNOSIS — R338 Other retention of urine: Secondary | ICD-10-CM | POA: Diagnosis not present

## 2014-10-19 DIAGNOSIS — M25512 Pain in left shoulder: Secondary | ICD-10-CM | POA: Diagnosis not present

## 2014-10-19 DIAGNOSIS — Z9181 History of falling: Secondary | ICD-10-CM | POA: Diagnosis not present

## 2014-10-19 DIAGNOSIS — M6281 Muscle weakness (generalized): Secondary | ICD-10-CM | POA: Diagnosis not present

## 2014-10-19 DIAGNOSIS — S7292XS Unspecified fracture of left femur, sequela: Secondary | ICD-10-CM | POA: Diagnosis not present

## 2014-10-19 DIAGNOSIS — R260 Ataxic gait: Secondary | ICD-10-CM | POA: Diagnosis not present

## 2014-10-20 DIAGNOSIS — R260 Ataxic gait: Secondary | ICD-10-CM | POA: Diagnosis not present

## 2014-10-20 DIAGNOSIS — E559 Vitamin D deficiency, unspecified: Secondary | ICD-10-CM | POA: Diagnosis not present

## 2014-10-20 DIAGNOSIS — R338 Other retention of urine: Secondary | ICD-10-CM | POA: Diagnosis not present

## 2014-10-20 DIAGNOSIS — I10 Essential (primary) hypertension: Secondary | ICD-10-CM | POA: Diagnosis not present

## 2014-10-20 DIAGNOSIS — M25512 Pain in left shoulder: Secondary | ICD-10-CM | POA: Diagnosis not present

## 2014-10-20 DIAGNOSIS — Z9181 History of falling: Secondary | ICD-10-CM | POA: Diagnosis not present

## 2014-10-20 DIAGNOSIS — S7292XS Unspecified fracture of left femur, sequela: Secondary | ICD-10-CM | POA: Diagnosis not present

## 2014-10-20 DIAGNOSIS — M6281 Muscle weakness (generalized): Secondary | ICD-10-CM | POA: Diagnosis not present

## 2014-10-21 DIAGNOSIS — M6281 Muscle weakness (generalized): Secondary | ICD-10-CM | POA: Diagnosis not present

## 2014-10-21 DIAGNOSIS — I10 Essential (primary) hypertension: Secondary | ICD-10-CM | POA: Diagnosis not present

## 2014-10-21 DIAGNOSIS — R338 Other retention of urine: Secondary | ICD-10-CM | POA: Diagnosis not present

## 2014-10-21 DIAGNOSIS — M25512 Pain in left shoulder: Secondary | ICD-10-CM | POA: Diagnosis not present

## 2014-10-21 DIAGNOSIS — R260 Ataxic gait: Secondary | ICD-10-CM | POA: Diagnosis not present

## 2014-10-21 DIAGNOSIS — Z9181 History of falling: Secondary | ICD-10-CM | POA: Diagnosis not present

## 2014-10-21 DIAGNOSIS — S7292XS Unspecified fracture of left femur, sequela: Secondary | ICD-10-CM | POA: Diagnosis not present

## 2014-10-25 DIAGNOSIS — I10 Essential (primary) hypertension: Secondary | ICD-10-CM | POA: Diagnosis not present

## 2014-10-25 DIAGNOSIS — S7292XS Unspecified fracture of left femur, sequela: Secondary | ICD-10-CM | POA: Diagnosis not present

## 2014-10-25 DIAGNOSIS — R338 Other retention of urine: Secondary | ICD-10-CM | POA: Diagnosis not present

## 2014-10-25 DIAGNOSIS — R260 Ataxic gait: Secondary | ICD-10-CM | POA: Diagnosis not present

## 2014-10-25 DIAGNOSIS — M25512 Pain in left shoulder: Secondary | ICD-10-CM | POA: Diagnosis not present

## 2014-10-25 DIAGNOSIS — Z9181 History of falling: Secondary | ICD-10-CM | POA: Diagnosis not present

## 2014-10-25 DIAGNOSIS — M6281 Muscle weakness (generalized): Secondary | ICD-10-CM | POA: Diagnosis not present

## 2014-10-26 DIAGNOSIS — Z9181 History of falling: Secondary | ICD-10-CM | POA: Diagnosis not present

## 2014-10-26 DIAGNOSIS — R260 Ataxic gait: Secondary | ICD-10-CM | POA: Diagnosis not present

## 2014-10-26 DIAGNOSIS — M6281 Muscle weakness (generalized): Secondary | ICD-10-CM | POA: Diagnosis not present

## 2014-10-26 DIAGNOSIS — S7292XS Unspecified fracture of left femur, sequela: Secondary | ICD-10-CM | POA: Diagnosis not present

## 2014-10-26 DIAGNOSIS — R338 Other retention of urine: Secondary | ICD-10-CM | POA: Diagnosis not present

## 2014-10-26 DIAGNOSIS — I10 Essential (primary) hypertension: Secondary | ICD-10-CM | POA: Diagnosis not present

## 2014-10-26 DIAGNOSIS — M25512 Pain in left shoulder: Secondary | ICD-10-CM | POA: Diagnosis not present

## 2014-10-28 DIAGNOSIS — I10 Essential (primary) hypertension: Secondary | ICD-10-CM | POA: Diagnosis not present

## 2014-10-28 DIAGNOSIS — R338 Other retention of urine: Secondary | ICD-10-CM | POA: Diagnosis not present

## 2014-10-28 DIAGNOSIS — S7292XS Unspecified fracture of left femur, sequela: Secondary | ICD-10-CM | POA: Diagnosis not present

## 2014-10-28 DIAGNOSIS — R260 Ataxic gait: Secondary | ICD-10-CM | POA: Diagnosis not present

## 2014-10-28 DIAGNOSIS — M25512 Pain in left shoulder: Secondary | ICD-10-CM | POA: Diagnosis not present

## 2014-10-28 DIAGNOSIS — M6281 Muscle weakness (generalized): Secondary | ICD-10-CM | POA: Diagnosis not present

## 2014-10-28 DIAGNOSIS — Z9181 History of falling: Secondary | ICD-10-CM | POA: Diagnosis not present

## 2014-10-29 DIAGNOSIS — R260 Ataxic gait: Secondary | ICD-10-CM | POA: Diagnosis not present

## 2014-10-29 DIAGNOSIS — M6281 Muscle weakness (generalized): Secondary | ICD-10-CM | POA: Diagnosis not present

## 2014-10-29 DIAGNOSIS — Z9181 History of falling: Secondary | ICD-10-CM | POA: Diagnosis not present

## 2014-10-29 DIAGNOSIS — M25512 Pain in left shoulder: Secondary | ICD-10-CM | POA: Diagnosis not present

## 2014-10-29 DIAGNOSIS — S7292XS Unspecified fracture of left femur, sequela: Secondary | ICD-10-CM | POA: Diagnosis not present

## 2014-10-29 DIAGNOSIS — R338 Other retention of urine: Secondary | ICD-10-CM | POA: Diagnosis not present

## 2014-10-29 DIAGNOSIS — I10 Essential (primary) hypertension: Secondary | ICD-10-CM | POA: Diagnosis not present

## 2014-11-02 DIAGNOSIS — R338 Other retention of urine: Secondary | ICD-10-CM | POA: Diagnosis not present

## 2014-11-02 DIAGNOSIS — M25512 Pain in left shoulder: Secondary | ICD-10-CM | POA: Diagnosis not present

## 2014-11-02 DIAGNOSIS — I10 Essential (primary) hypertension: Secondary | ICD-10-CM | POA: Diagnosis not present

## 2014-11-02 DIAGNOSIS — M6281 Muscle weakness (generalized): Secondary | ICD-10-CM | POA: Diagnosis not present

## 2014-11-02 DIAGNOSIS — R260 Ataxic gait: Secondary | ICD-10-CM | POA: Diagnosis not present

## 2014-11-02 DIAGNOSIS — Z9181 History of falling: Secondary | ICD-10-CM | POA: Diagnosis not present

## 2014-11-02 DIAGNOSIS — S7292XS Unspecified fracture of left femur, sequela: Secondary | ICD-10-CM | POA: Diagnosis not present

## 2014-11-05 DIAGNOSIS — M25512 Pain in left shoulder: Secondary | ICD-10-CM | POA: Diagnosis not present

## 2014-11-05 DIAGNOSIS — R338 Other retention of urine: Secondary | ICD-10-CM | POA: Diagnosis not present

## 2014-11-05 DIAGNOSIS — R260 Ataxic gait: Secondary | ICD-10-CM | POA: Diagnosis not present

## 2014-11-05 DIAGNOSIS — S7292XS Unspecified fracture of left femur, sequela: Secondary | ICD-10-CM | POA: Diagnosis not present

## 2014-11-05 DIAGNOSIS — M6281 Muscle weakness (generalized): Secondary | ICD-10-CM | POA: Diagnosis not present

## 2014-11-05 DIAGNOSIS — Z9181 History of falling: Secondary | ICD-10-CM | POA: Diagnosis not present

## 2014-11-05 DIAGNOSIS — I10 Essential (primary) hypertension: Secondary | ICD-10-CM | POA: Diagnosis not present

## 2014-11-09 DIAGNOSIS — S7292XS Unspecified fracture of left femur, sequela: Secondary | ICD-10-CM | POA: Diagnosis not present

## 2014-11-09 DIAGNOSIS — M25512 Pain in left shoulder: Secondary | ICD-10-CM | POA: Diagnosis not present

## 2014-11-09 DIAGNOSIS — R260 Ataxic gait: Secondary | ICD-10-CM | POA: Diagnosis not present

## 2014-11-09 DIAGNOSIS — M6281 Muscle weakness (generalized): Secondary | ICD-10-CM | POA: Diagnosis not present

## 2014-11-09 DIAGNOSIS — R338 Other retention of urine: Secondary | ICD-10-CM | POA: Diagnosis not present

## 2014-11-09 DIAGNOSIS — I10 Essential (primary) hypertension: Secondary | ICD-10-CM | POA: Diagnosis not present

## 2014-11-09 DIAGNOSIS — Z9181 History of falling: Secondary | ICD-10-CM | POA: Diagnosis not present

## 2014-11-11 DIAGNOSIS — R338 Other retention of urine: Secondary | ICD-10-CM | POA: Diagnosis not present

## 2014-11-11 DIAGNOSIS — E559 Vitamin D deficiency, unspecified: Secondary | ICD-10-CM | POA: Diagnosis not present

## 2014-11-11 DIAGNOSIS — Z9181 History of falling: Secondary | ICD-10-CM | POA: Diagnosis not present

## 2014-11-11 DIAGNOSIS — M25512 Pain in left shoulder: Secondary | ICD-10-CM | POA: Diagnosis not present

## 2014-11-11 DIAGNOSIS — M6281 Muscle weakness (generalized): Secondary | ICD-10-CM | POA: Diagnosis not present

## 2014-11-11 DIAGNOSIS — R260 Ataxic gait: Secondary | ICD-10-CM | POA: Diagnosis not present

## 2014-11-11 DIAGNOSIS — R339 Retention of urine, unspecified: Secondary | ICD-10-CM | POA: Diagnosis not present

## 2014-11-11 DIAGNOSIS — D649 Anemia, unspecified: Secondary | ICD-10-CM | POA: Diagnosis not present

## 2014-11-11 DIAGNOSIS — S7292XS Unspecified fracture of left femur, sequela: Secondary | ICD-10-CM | POA: Diagnosis not present

## 2014-11-11 DIAGNOSIS — I10 Essential (primary) hypertension: Secondary | ICD-10-CM | POA: Diagnosis not present

## 2014-11-12 DIAGNOSIS — R338 Other retention of urine: Secondary | ICD-10-CM | POA: Diagnosis not present

## 2014-11-12 DIAGNOSIS — R41841 Cognitive communication deficit: Secondary | ICD-10-CM | POA: Diagnosis not present

## 2014-11-12 DIAGNOSIS — I10 Essential (primary) hypertension: Secondary | ICD-10-CM | POA: Diagnosis not present

## 2014-11-16 DIAGNOSIS — R41841 Cognitive communication deficit: Secondary | ICD-10-CM | POA: Diagnosis not present

## 2014-11-16 DIAGNOSIS — R338 Other retention of urine: Secondary | ICD-10-CM | POA: Diagnosis not present

## 2014-11-16 DIAGNOSIS — I10 Essential (primary) hypertension: Secondary | ICD-10-CM | POA: Diagnosis not present

## 2014-11-19 DIAGNOSIS — R338 Other retention of urine: Secondary | ICD-10-CM | POA: Diagnosis not present

## 2014-11-19 DIAGNOSIS — I10 Essential (primary) hypertension: Secondary | ICD-10-CM | POA: Diagnosis not present

## 2014-11-19 DIAGNOSIS — R41841 Cognitive communication deficit: Secondary | ICD-10-CM | POA: Diagnosis not present

## 2014-11-23 DIAGNOSIS — R41841 Cognitive communication deficit: Secondary | ICD-10-CM | POA: Diagnosis not present

## 2014-11-23 DIAGNOSIS — I10 Essential (primary) hypertension: Secondary | ICD-10-CM | POA: Diagnosis not present

## 2014-11-23 DIAGNOSIS — R338 Other retention of urine: Secondary | ICD-10-CM | POA: Diagnosis not present

## 2014-11-26 DIAGNOSIS — R41841 Cognitive communication deficit: Secondary | ICD-10-CM | POA: Diagnosis not present

## 2014-11-26 DIAGNOSIS — I10 Essential (primary) hypertension: Secondary | ICD-10-CM | POA: Diagnosis not present

## 2014-11-26 DIAGNOSIS — R338 Other retention of urine: Secondary | ICD-10-CM | POA: Diagnosis not present

## 2014-11-30 DIAGNOSIS — I10 Essential (primary) hypertension: Secondary | ICD-10-CM | POA: Diagnosis not present

## 2014-11-30 DIAGNOSIS — R338 Other retention of urine: Secondary | ICD-10-CM | POA: Diagnosis not present

## 2014-11-30 DIAGNOSIS — R41841 Cognitive communication deficit: Secondary | ICD-10-CM | POA: Diagnosis not present

## 2014-12-01 DIAGNOSIS — I10 Essential (primary) hypertension: Secondary | ICD-10-CM | POA: Diagnosis not present

## 2014-12-01 DIAGNOSIS — R338 Other retention of urine: Secondary | ICD-10-CM | POA: Diagnosis not present

## 2014-12-01 DIAGNOSIS — R41841 Cognitive communication deficit: Secondary | ICD-10-CM | POA: Diagnosis not present

## 2014-12-03 DIAGNOSIS — I10 Essential (primary) hypertension: Secondary | ICD-10-CM | POA: Diagnosis not present

## 2014-12-03 DIAGNOSIS — R338 Other retention of urine: Secondary | ICD-10-CM | POA: Diagnosis not present

## 2014-12-03 DIAGNOSIS — R41841 Cognitive communication deficit: Secondary | ICD-10-CM | POA: Diagnosis not present

## 2014-12-06 DIAGNOSIS — R41841 Cognitive communication deficit: Secondary | ICD-10-CM | POA: Diagnosis not present

## 2014-12-06 DIAGNOSIS — R338 Other retention of urine: Secondary | ICD-10-CM | POA: Diagnosis not present

## 2014-12-06 DIAGNOSIS — I10 Essential (primary) hypertension: Secondary | ICD-10-CM | POA: Diagnosis not present

## 2014-12-08 DIAGNOSIS — R338 Other retention of urine: Secondary | ICD-10-CM | POA: Diagnosis not present

## 2014-12-08 DIAGNOSIS — R41841 Cognitive communication deficit: Secondary | ICD-10-CM | POA: Diagnosis not present

## 2014-12-08 DIAGNOSIS — I10 Essential (primary) hypertension: Secondary | ICD-10-CM | POA: Diagnosis not present

## 2014-12-09 DIAGNOSIS — M6281 Muscle weakness (generalized): Secondary | ICD-10-CM | POA: Diagnosis not present

## 2014-12-09 DIAGNOSIS — E559 Vitamin D deficiency, unspecified: Secondary | ICD-10-CM | POA: Diagnosis not present

## 2014-12-09 DIAGNOSIS — R339 Retention of urine, unspecified: Secondary | ICD-10-CM | POA: Diagnosis not present

## 2014-12-09 DIAGNOSIS — R338 Other retention of urine: Secondary | ICD-10-CM | POA: Diagnosis not present

## 2014-12-09 DIAGNOSIS — R41841 Cognitive communication deficit: Secondary | ICD-10-CM | POA: Diagnosis not present

## 2014-12-09 DIAGNOSIS — D649 Anemia, unspecified: Secondary | ICD-10-CM | POA: Diagnosis not present

## 2014-12-09 DIAGNOSIS — I10 Essential (primary) hypertension: Secondary | ICD-10-CM | POA: Diagnosis not present

## 2014-12-14 DIAGNOSIS — I10 Essential (primary) hypertension: Secondary | ICD-10-CM | POA: Diagnosis not present

## 2014-12-14 DIAGNOSIS — R338 Other retention of urine: Secondary | ICD-10-CM | POA: Diagnosis not present

## 2014-12-14 DIAGNOSIS — R41841 Cognitive communication deficit: Secondary | ICD-10-CM | POA: Diagnosis not present

## 2014-12-16 DIAGNOSIS — R338 Other retention of urine: Secondary | ICD-10-CM | POA: Diagnosis not present

## 2014-12-16 DIAGNOSIS — R4189 Other symptoms and signs involving cognitive functions and awareness: Secondary | ICD-10-CM | POA: Diagnosis not present

## 2014-12-16 DIAGNOSIS — I10 Essential (primary) hypertension: Secondary | ICD-10-CM | POA: Diagnosis not present

## 2014-12-16 DIAGNOSIS — M6281 Muscle weakness (generalized): Secondary | ICD-10-CM | POA: Diagnosis not present

## 2014-12-21 DIAGNOSIS — I10 Essential (primary) hypertension: Secondary | ICD-10-CM | POA: Diagnosis not present

## 2014-12-21 DIAGNOSIS — R338 Other retention of urine: Secondary | ICD-10-CM | POA: Diagnosis not present

## 2014-12-21 DIAGNOSIS — R4189 Other symptoms and signs involving cognitive functions and awareness: Secondary | ICD-10-CM | POA: Diagnosis not present

## 2014-12-21 DIAGNOSIS — M6281 Muscle weakness (generalized): Secondary | ICD-10-CM | POA: Diagnosis not present

## 2014-12-24 DIAGNOSIS — I10 Essential (primary) hypertension: Secondary | ICD-10-CM | POA: Diagnosis not present

## 2014-12-24 DIAGNOSIS — R338 Other retention of urine: Secondary | ICD-10-CM | POA: Diagnosis not present

## 2014-12-24 DIAGNOSIS — M6281 Muscle weakness (generalized): Secondary | ICD-10-CM | POA: Diagnosis not present

## 2014-12-24 DIAGNOSIS — R4189 Other symptoms and signs involving cognitive functions and awareness: Secondary | ICD-10-CM | POA: Diagnosis not present

## 2014-12-27 DIAGNOSIS — R338 Other retention of urine: Secondary | ICD-10-CM | POA: Diagnosis not present

## 2014-12-27 DIAGNOSIS — R4189 Other symptoms and signs involving cognitive functions and awareness: Secondary | ICD-10-CM | POA: Diagnosis not present

## 2014-12-27 DIAGNOSIS — M6281 Muscle weakness (generalized): Secondary | ICD-10-CM | POA: Diagnosis not present

## 2014-12-27 DIAGNOSIS — I10 Essential (primary) hypertension: Secondary | ICD-10-CM | POA: Diagnosis not present

## 2014-12-29 DIAGNOSIS — I10 Essential (primary) hypertension: Secondary | ICD-10-CM | POA: Diagnosis not present

## 2014-12-31 DIAGNOSIS — R4189 Other symptoms and signs involving cognitive functions and awareness: Secondary | ICD-10-CM | POA: Diagnosis not present

## 2014-12-31 DIAGNOSIS — M6281 Muscle weakness (generalized): Secondary | ICD-10-CM | POA: Diagnosis not present

## 2014-12-31 DIAGNOSIS — R338 Other retention of urine: Secondary | ICD-10-CM | POA: Diagnosis not present

## 2014-12-31 DIAGNOSIS — I10 Essential (primary) hypertension: Secondary | ICD-10-CM | POA: Diagnosis not present

## 2015-01-04 DIAGNOSIS — R338 Other retention of urine: Secondary | ICD-10-CM | POA: Diagnosis not present

## 2015-01-04 DIAGNOSIS — M6281 Muscle weakness (generalized): Secondary | ICD-10-CM | POA: Diagnosis not present

## 2015-01-04 DIAGNOSIS — I10 Essential (primary) hypertension: Secondary | ICD-10-CM | POA: Diagnosis not present

## 2015-01-04 DIAGNOSIS — R4189 Other symptoms and signs involving cognitive functions and awareness: Secondary | ICD-10-CM | POA: Diagnosis not present

## 2015-01-07 DIAGNOSIS — R4189 Other symptoms and signs involving cognitive functions and awareness: Secondary | ICD-10-CM | POA: Diagnosis not present

## 2015-01-07 DIAGNOSIS — R338 Other retention of urine: Secondary | ICD-10-CM | POA: Diagnosis not present

## 2015-01-07 DIAGNOSIS — I10 Essential (primary) hypertension: Secondary | ICD-10-CM | POA: Diagnosis not present

## 2015-01-07 DIAGNOSIS — M6281 Muscle weakness (generalized): Secondary | ICD-10-CM | POA: Diagnosis not present

## 2015-01-11 DIAGNOSIS — I10 Essential (primary) hypertension: Secondary | ICD-10-CM | POA: Diagnosis not present

## 2015-01-11 DIAGNOSIS — R338 Other retention of urine: Secondary | ICD-10-CM | POA: Diagnosis not present

## 2015-01-11 DIAGNOSIS — M6281 Muscle weakness (generalized): Secondary | ICD-10-CM | POA: Diagnosis not present

## 2015-01-11 DIAGNOSIS — R4189 Other symptoms and signs involving cognitive functions and awareness: Secondary | ICD-10-CM | POA: Diagnosis not present

## 2015-01-14 DIAGNOSIS — I10 Essential (primary) hypertension: Secondary | ICD-10-CM | POA: Diagnosis not present

## 2015-01-14 DIAGNOSIS — R338 Other retention of urine: Secondary | ICD-10-CM | POA: Diagnosis not present

## 2015-01-14 DIAGNOSIS — R4189 Other symptoms and signs involving cognitive functions and awareness: Secondary | ICD-10-CM | POA: Diagnosis not present

## 2015-01-14 DIAGNOSIS — M6281 Muscle weakness (generalized): Secondary | ICD-10-CM | POA: Diagnosis not present

## 2015-01-18 DIAGNOSIS — I10 Essential (primary) hypertension: Secondary | ICD-10-CM | POA: Diagnosis not present

## 2015-01-18 DIAGNOSIS — R4189 Other symptoms and signs involving cognitive functions and awareness: Secondary | ICD-10-CM | POA: Diagnosis not present

## 2015-01-18 DIAGNOSIS — M6281 Muscle weakness (generalized): Secondary | ICD-10-CM | POA: Diagnosis not present

## 2015-01-18 DIAGNOSIS — R338 Other retention of urine: Secondary | ICD-10-CM | POA: Diagnosis not present

## 2015-01-20 DIAGNOSIS — R339 Retention of urine, unspecified: Secondary | ICD-10-CM | POA: Diagnosis not present

## 2015-01-20 DIAGNOSIS — I10 Essential (primary) hypertension: Secondary | ICD-10-CM | POA: Diagnosis not present

## 2015-01-20 DIAGNOSIS — R338 Other retention of urine: Secondary | ICD-10-CM | POA: Diagnosis not present

## 2015-01-20 DIAGNOSIS — R4189 Other symptoms and signs involving cognitive functions and awareness: Secondary | ICD-10-CM | POA: Diagnosis not present

## 2015-01-20 DIAGNOSIS — D649 Anemia, unspecified: Secondary | ICD-10-CM | POA: Diagnosis not present

## 2015-01-20 DIAGNOSIS — E559 Vitamin D deficiency, unspecified: Secondary | ICD-10-CM | POA: Diagnosis not present

## 2015-01-20 DIAGNOSIS — M6281 Muscle weakness (generalized): Secondary | ICD-10-CM | POA: Diagnosis not present

## 2015-01-25 DIAGNOSIS — M6281 Muscle weakness (generalized): Secondary | ICD-10-CM | POA: Diagnosis not present

## 2015-01-25 DIAGNOSIS — R338 Other retention of urine: Secondary | ICD-10-CM | POA: Diagnosis not present

## 2015-01-25 DIAGNOSIS — I10 Essential (primary) hypertension: Secondary | ICD-10-CM | POA: Diagnosis not present

## 2015-01-25 DIAGNOSIS — R4189 Other symptoms and signs involving cognitive functions and awareness: Secondary | ICD-10-CM | POA: Diagnosis not present

## 2015-01-26 DIAGNOSIS — E559 Vitamin D deficiency, unspecified: Secondary | ICD-10-CM | POA: Diagnosis not present

## 2015-01-26 DIAGNOSIS — D519 Vitamin B12 deficiency anemia, unspecified: Secondary | ICD-10-CM | POA: Diagnosis not present

## 2015-01-28 DIAGNOSIS — R338 Other retention of urine: Secondary | ICD-10-CM | POA: Diagnosis not present

## 2015-01-28 DIAGNOSIS — R4189 Other symptoms and signs involving cognitive functions and awareness: Secondary | ICD-10-CM | POA: Diagnosis not present

## 2015-01-28 DIAGNOSIS — I10 Essential (primary) hypertension: Secondary | ICD-10-CM | POA: Diagnosis not present

## 2015-01-28 DIAGNOSIS — M6281 Muscle weakness (generalized): Secondary | ICD-10-CM | POA: Diagnosis not present

## 2015-01-31 DIAGNOSIS — R338 Other retention of urine: Secondary | ICD-10-CM | POA: Diagnosis not present

## 2015-01-31 DIAGNOSIS — M6281 Muscle weakness (generalized): Secondary | ICD-10-CM | POA: Diagnosis not present

## 2015-01-31 DIAGNOSIS — I10 Essential (primary) hypertension: Secondary | ICD-10-CM | POA: Diagnosis not present

## 2015-01-31 DIAGNOSIS — R4189 Other symptoms and signs involving cognitive functions and awareness: Secondary | ICD-10-CM | POA: Diagnosis not present

## 2015-02-03 DIAGNOSIS — M6281 Muscle weakness (generalized): Secondary | ICD-10-CM | POA: Diagnosis not present

## 2015-02-03 DIAGNOSIS — I10 Essential (primary) hypertension: Secondary | ICD-10-CM | POA: Diagnosis not present

## 2015-02-03 DIAGNOSIS — R4189 Other symptoms and signs involving cognitive functions and awareness: Secondary | ICD-10-CM | POA: Diagnosis not present

## 2015-02-03 DIAGNOSIS — R338 Other retention of urine: Secondary | ICD-10-CM | POA: Diagnosis not present

## 2015-03-07 ENCOUNTER — Encounter: Payer: Self-pay | Admitting: Vascular Surgery

## 2015-03-07 ENCOUNTER — Other Ambulatory Visit: Payer: Self-pay | Admitting: *Deleted

## 2015-03-07 DIAGNOSIS — Z01812 Encounter for preprocedural laboratory examination: Secondary | ICD-10-CM

## 2015-03-08 ENCOUNTER — Encounter: Payer: Self-pay | Admitting: Vascular Surgery

## 2015-03-08 ENCOUNTER — Ambulatory Visit
Admission: RE | Admit: 2015-03-08 | Discharge: 2015-03-08 | Disposition: A | Discharge status: Home / Self Care | Payer: Medicare Other | Source: Ambulatory Visit | Attending: Vascular Surgery | Admitting: Vascular Surgery

## 2015-03-08 ENCOUNTER — Other Ambulatory Visit: Payer: Self-pay | Admitting: *Deleted

## 2015-03-08 ENCOUNTER — Ambulatory Visit (INDEPENDENT_AMBULATORY_CARE_PROVIDER_SITE_OTHER): Payer: Medicare Other | Admitting: Vascular Surgery

## 2015-03-08 VITALS — BP 93/65 | HR 76 | Temp 98.0°F | Resp 17 | Ht 64.0 in | Wt 137.1 lb

## 2015-03-08 DIAGNOSIS — Z09 Encounter for follow-up examination after completed treatment for conditions other than malignant neoplasm: Secondary | ICD-10-CM

## 2015-03-08 DIAGNOSIS — S2500XD Unspecified injury of thoracic aorta, subsequent encounter: Secondary | ICD-10-CM | POA: Diagnosis not present

## 2015-03-08 DIAGNOSIS — Z01812 Encounter for preprocedural laboratory examination: Secondary | ICD-10-CM

## 2015-03-08 DIAGNOSIS — Z959 Presence of cardiac and vascular implant and graft, unspecified: Secondary | ICD-10-CM | POA: Diagnosis not present

## 2015-03-08 LAB — CREATININE, SERUM: Creat: 0.9 mg/dL (ref 0.70–1.11)

## 2015-03-08 MED ORDER — IOPAMIDOL (ISOVUE-370) INJECTION 76%
75.0000 mL | Freq: Once | INTRAVENOUS | Status: AC | PRN
  Administered 2015-03-08: 75 mL via INTRAVENOUS

## 2015-03-08 NOTE — Addendum Note (Signed)
Addended by: Adria Dill L on: 03/08/2015 04:42 PM   Modules accepted: Orders
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
## 2015-04-05 IMAGING — CR DG CHEST 1V PORT
1 series · 1 of 1 positions shown · non-contrast
Comparison: Chest x-ray from the same day at [DATE] p.m.

CLINICAL DATA: Postop study.  Check support apparatus.

EXAM:
PORTABLE CHEST - 1 VIEW

[AP]
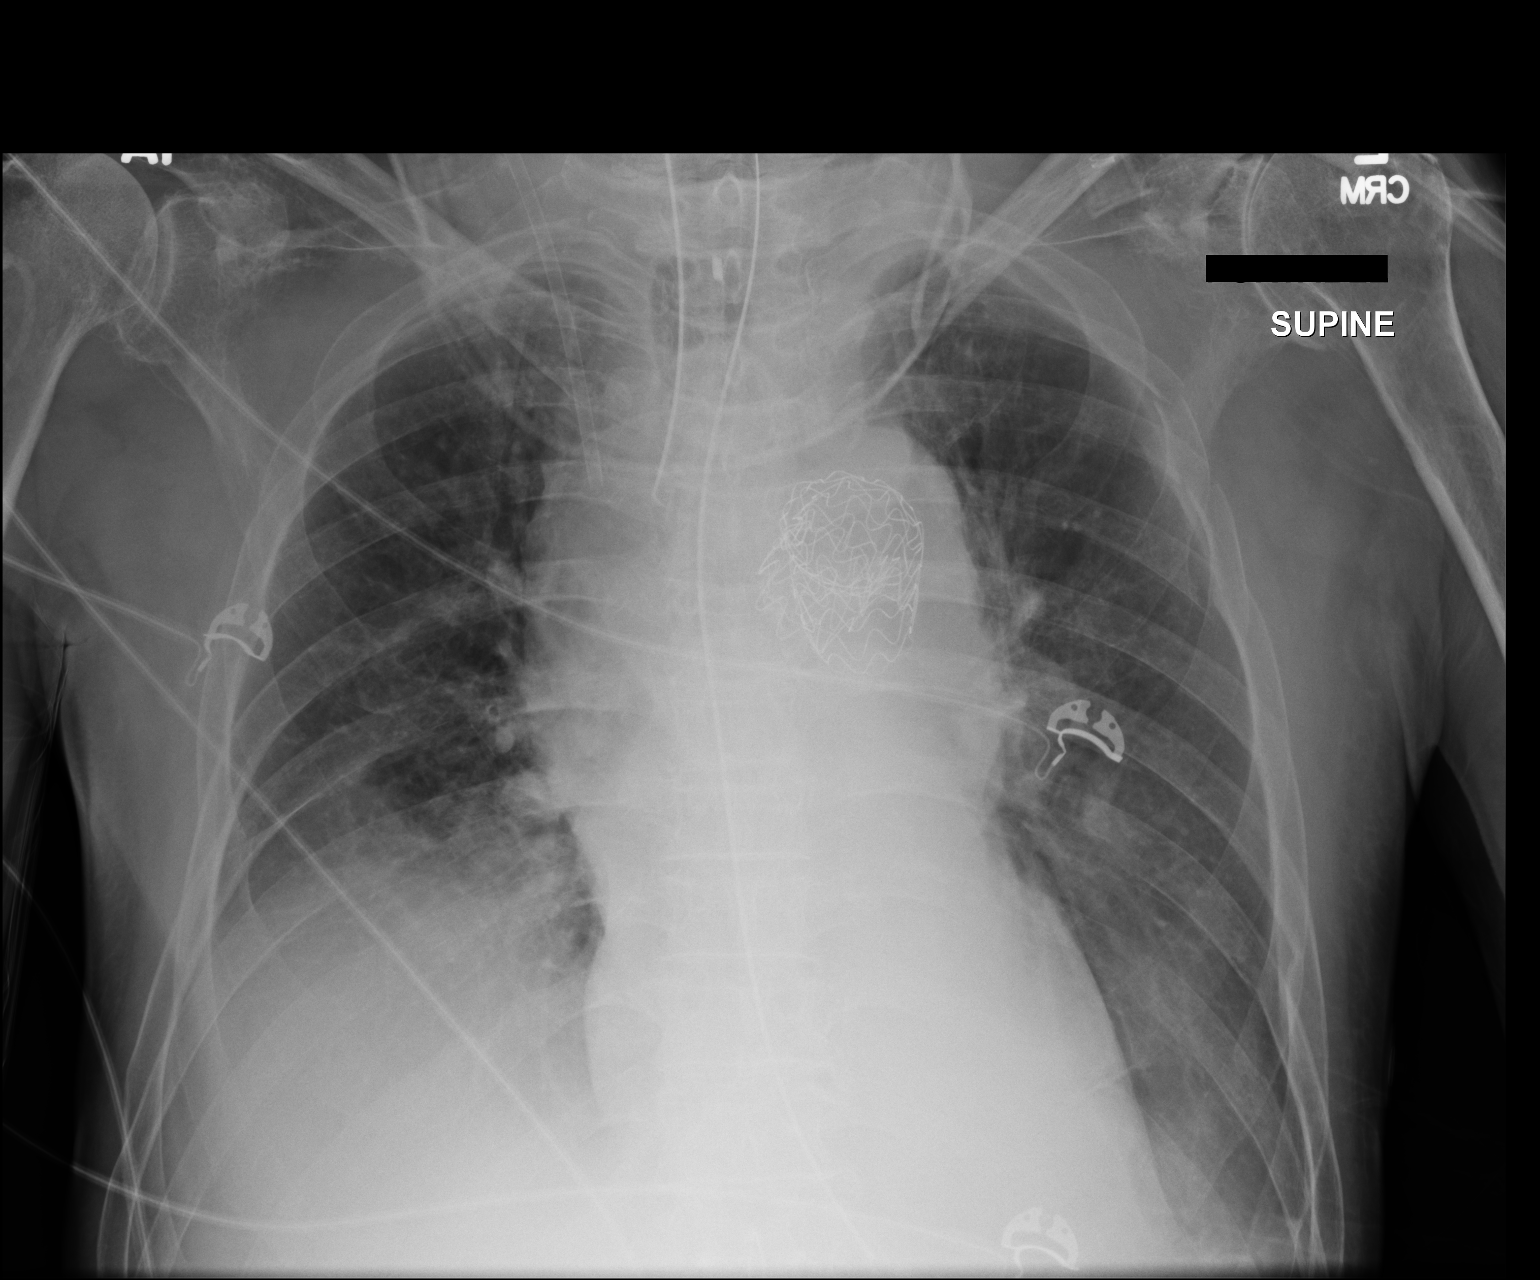

[1 of 1 positions shown; findings below may reference images not displayed]

FINDINGS: Endotracheal tube ends in the mid thoracic trachea. Enteric tube
continues below the diaphragm. There is a right IJ sheath, tip near
the upper SVC.

There is an aortic stent centered at the level of the arch. New hazy
basilar densities, most likely atelectasis or effusions. No edema or
visible pneumothorax. Left-sided rib fractures as noted on prior CT.
IMPRESSION: 1. Tubes and lines in good position.
2. Postoperative atelectasis or pleural effusions bilaterally.

## 2015-07-04 DIAGNOSIS — D513 Other dietary vitamin B12 deficiency anemia: Secondary | ICD-10-CM | POA: Diagnosis not present

## 2015-07-04 DIAGNOSIS — M6281 Muscle weakness (generalized): Secondary | ICD-10-CM | POA: Diagnosis not present

## 2015-07-04 DIAGNOSIS — F0390 Unspecified dementia without behavioral disturbance: Secondary | ICD-10-CM | POA: Diagnosis not present

## 2015-07-04 DIAGNOSIS — E559 Vitamin D deficiency, unspecified: Secondary | ICD-10-CM | POA: Diagnosis not present

## 2015-07-19 DIAGNOSIS — S06309D Unspecified focal traumatic brain injury with loss of consciousness of unspecified duration, subsequent encounter: Secondary | ICD-10-CM | POA: Diagnosis not present

## 2015-08-03 DIAGNOSIS — E782 Mixed hyperlipidemia: Secondary | ICD-10-CM | POA: Diagnosis not present

## 2015-08-03 DIAGNOSIS — I10 Essential (primary) hypertension: Secondary | ICD-10-CM | POA: Diagnosis not present

## 2015-08-03 DIAGNOSIS — E039 Hypothyroidism, unspecified: Secondary | ICD-10-CM | POA: Diagnosis not present

## 2015-08-03 DIAGNOSIS — E119 Type 2 diabetes mellitus without complications: Secondary | ICD-10-CM | POA: Diagnosis not present

## 2015-08-13 ENCOUNTER — Emergency Department (HOSPITAL_COMMUNITY)
Admission: EM | Admit: 2015-08-13 | Discharge: 2015-08-14 | Disposition: A | Discharge status: Home / Self Care | Payer: Medicare Other | Attending: Emergency Medicine | Admitting: Emergency Medicine

## 2015-08-13 ENCOUNTER — Encounter (HOSPITAL_COMMUNITY): Payer: Self-pay | Admitting: Emergency Medicine

## 2015-08-13 DIAGNOSIS — Z87891 Personal history of nicotine dependence: Secondary | ICD-10-CM | POA: Insufficient documentation

## 2015-08-13 DIAGNOSIS — R609 Edema, unspecified: Secondary | ICD-10-CM | POA: Diagnosis not present

## 2015-08-13 DIAGNOSIS — R2242 Localized swelling, mass and lump, left lower limb: Secondary | ICD-10-CM | POA: Insufficient documentation

## 2015-08-13 DIAGNOSIS — Z8781 Personal history of (healed) traumatic fracture: Secondary | ICD-10-CM | POA: Diagnosis not present

## 2015-08-13 DIAGNOSIS — Z79899 Other long term (current) drug therapy: Secondary | ICD-10-CM | POA: Diagnosis not present

## 2015-08-13 DIAGNOSIS — Z7982 Long term (current) use of aspirin: Secondary | ICD-10-CM | POA: Insufficient documentation

## 2015-08-13 DIAGNOSIS — M7989 Other specified soft tissue disorders: Secondary | ICD-10-CM | POA: Diagnosis not present

## 2015-08-13 HISTORY — DX: Fracture of unspecified part of neck of unspecified femur, initial encounter for closed fracture: S72.009A

## 2015-08-13 LAB — CBC WITH DIFFERENTIAL/PLATELET
BASOS ABS: 0 10*3/uL (ref 0.0–0.1)
BASOS PCT: 0 %
EOS ABS: 0.4 10*3/uL (ref 0.0–0.7)
EOS PCT: 6 %
HCT: 36 % — ABNORMAL LOW (ref 39.0–52.0)
Hemoglobin: 11.5 g/dL — ABNORMAL LOW (ref 13.0–17.0)
LYMPHS PCT: 31 %
Lymphs Abs: 2.1 10*3/uL (ref 0.7–4.0)
MCH: 31.9 pg (ref 26.0–34.0)
MCHC: 31.9 g/dL (ref 30.0–36.0)
MCV: 100 fL (ref 78.0–100.0)
Monocytes Absolute: 0.8 10*3/uL (ref 0.1–1.0)
Monocytes Relative: 11 %
Neutro Abs: 3.5 10*3/uL (ref 1.7–7.7)
Neutrophils Relative %: 52 %
PLATELETS: 248 10*3/uL (ref 150–400)
RBC: 3.6 MIL/uL — AB (ref 4.22–5.81)
RDW: 12.8 % (ref 11.5–15.5)
WBC: 6.8 10*3/uL (ref 4.0–10.5)

## 2015-08-13 LAB — BASIC METABOLIC PANEL
ANION GAP: 6 (ref 5–15)
BUN: 21 mg/dL — ABNORMAL HIGH (ref 6–20)
CALCIUM: 8.5 mg/dL — AB (ref 8.9–10.3)
CO2: 29 mmol/L (ref 22–32)
Chloride: 103 mmol/L (ref 101–111)
Creatinine, Ser: 0.81 mg/dL (ref 0.61–1.24)
Glucose, Bld: 107 mg/dL — ABNORMAL HIGH (ref 65–99)
POTASSIUM: 4.4 mmol/L (ref 3.5–5.1)
SODIUM: 138 mmol/L (ref 135–145)
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
## 2015-08-13 NOTE — Discharge Instructions (Signed)
You need to return to [facility] at 8 AM for a Doppler study of your lower leg

## 2015-08-14 ENCOUNTER — Inpatient Hospital Stay (HOSPITAL_COMMUNITY): Admission: RE | Admit: 2015-08-14 | Payer: Self-pay | Source: Ambulatory Visit

## 2015-08-14 DIAGNOSIS — M79662 Pain in left lower leg: Secondary | ICD-10-CM | POA: Diagnosis not present

## 2015-08-14 NOTE — ED Notes (Signed)
Report given to melisa, med tech of receiving facility and informed of he pt.'s follow up at Vascular center at [facility] in a.m. , verbalized understanding

## 2015-08-14 NOTE — ED Notes (Signed)
[facility] called for pt.'s transport back to [facility] at [facility] [address]

## 2015-08-24 DIAGNOSIS — S06309D Unspecified focal traumatic brain injury with loss of consciousness of unspecified duration, subsequent encounter: Secondary | ICD-10-CM | POA: Diagnosis not present

## 2015-08-30 DIAGNOSIS — B351 Tinea unguium: Secondary | ICD-10-CM | POA: Diagnosis not present

## 2015-08-30 DIAGNOSIS — M79672 Pain in left foot: Secondary | ICD-10-CM | POA: Diagnosis not present

## 2015-08-30 DIAGNOSIS — L84 Corns and callosities: Secondary | ICD-10-CM | POA: Diagnosis not present

## 2015-08-30 DIAGNOSIS — M79671 Pain in right foot: Secondary | ICD-10-CM | POA: Diagnosis not present

## 2015-09-01 DIAGNOSIS — R600 Localized edema: Secondary | ICD-10-CM | POA: Diagnosis not present

## 2015-09-01 DIAGNOSIS — Z9181 History of falling: Secondary | ICD-10-CM | POA: Diagnosis not present

## 2015-09-01 DIAGNOSIS — F0390 Unspecified dementia without behavioral disturbance: Secondary | ICD-10-CM | POA: Diagnosis not present

## 2015-09-01 DIAGNOSIS — Z87820 Personal history of traumatic brain injury: Secondary | ICD-10-CM | POA: Diagnosis not present

## 2015-09-01 DIAGNOSIS — Z87891 Personal history of nicotine dependence: Secondary | ICD-10-CM | POA: Diagnosis not present

## 2015-09-01 DIAGNOSIS — R41841 Cognitive communication deficit: Secondary | ICD-10-CM | POA: Diagnosis not present

## 2015-09-07 DIAGNOSIS — R41841 Cognitive communication deficit: Secondary | ICD-10-CM | POA: Diagnosis not present

## 2015-09-07 DIAGNOSIS — Z87820 Personal history of traumatic brain injury: Secondary | ICD-10-CM | POA: Diagnosis not present

## 2015-09-07 DIAGNOSIS — F0390 Unspecified dementia without behavioral disturbance: Secondary | ICD-10-CM | POA: Diagnosis not present

## 2015-09-07 DIAGNOSIS — Z9181 History of falling: Secondary | ICD-10-CM | POA: Diagnosis not present

## 2015-09-07 DIAGNOSIS — Z87891 Personal history of nicotine dependence: Secondary | ICD-10-CM | POA: Diagnosis not present

## 2015-09-09 DIAGNOSIS — F0390 Unspecified dementia without behavioral disturbance: Secondary | ICD-10-CM | POA: Diagnosis not present

## 2015-09-09 DIAGNOSIS — Z9181 History of falling: Secondary | ICD-10-CM | POA: Diagnosis not present

## 2015-09-09 DIAGNOSIS — Z87820 Personal history of traumatic brain injury: Secondary | ICD-10-CM | POA: Diagnosis not present

## 2015-09-09 DIAGNOSIS — Z87891 Personal history of nicotine dependence: Secondary | ICD-10-CM | POA: Diagnosis not present

## 2015-09-09 DIAGNOSIS — R41841 Cognitive communication deficit: Secondary | ICD-10-CM | POA: Diagnosis not present

## 2015-09-13 DIAGNOSIS — E559 Vitamin D deficiency, unspecified: Secondary | ICD-10-CM | POA: Diagnosis not present

## 2015-09-13 DIAGNOSIS — S06309D Unspecified focal traumatic brain injury with loss of consciousness of unspecified duration, subsequent encounter: Secondary | ICD-10-CM | POA: Diagnosis not present

## 2015-09-13 DIAGNOSIS — D513 Other dietary vitamin B12 deficiency anemia: Secondary | ICD-10-CM | POA: Diagnosis not present

## 2015-09-13 DIAGNOSIS — M6281 Muscle weakness (generalized): Secondary | ICD-10-CM | POA: Diagnosis not present

## 2015-09-14 DIAGNOSIS — R41841 Cognitive communication deficit: Secondary | ICD-10-CM | POA: Diagnosis not present

## 2015-09-14 DIAGNOSIS — Z9181 History of falling: Secondary | ICD-10-CM | POA: Diagnosis not present

## 2015-09-14 DIAGNOSIS — Z87820 Personal history of traumatic brain injury: Secondary | ICD-10-CM | POA: Diagnosis not present

## 2015-09-14 DIAGNOSIS — F0390 Unspecified dementia without behavioral disturbance: Secondary | ICD-10-CM | POA: Diagnosis not present

## 2015-09-14 DIAGNOSIS — Z87891 Personal history of nicotine dependence: Secondary | ICD-10-CM | POA: Diagnosis not present

## 2015-09-15 DIAGNOSIS — R41841 Cognitive communication deficit: Secondary | ICD-10-CM | POA: Diagnosis not present

## 2015-09-15 DIAGNOSIS — Z87891 Personal history of nicotine dependence: Secondary | ICD-10-CM | POA: Diagnosis not present

## 2015-09-15 DIAGNOSIS — F0390 Unspecified dementia without behavioral disturbance: Secondary | ICD-10-CM | POA: Diagnosis not present

## 2015-09-15 DIAGNOSIS — Z9181 History of falling: Secondary | ICD-10-CM | POA: Diagnosis not present

## 2015-09-15 DIAGNOSIS — Z87820 Personal history of traumatic brain injury: Secondary | ICD-10-CM | POA: Diagnosis not present

## 2015-09-16 DIAGNOSIS — Z87820 Personal history of traumatic brain injury: Secondary | ICD-10-CM | POA: Diagnosis not present

## 2015-09-16 DIAGNOSIS — R41841 Cognitive communication deficit: Secondary | ICD-10-CM | POA: Diagnosis not present

## 2015-09-16 DIAGNOSIS — Z87891 Personal history of nicotine dependence: Secondary | ICD-10-CM | POA: Diagnosis not present

## 2015-09-16 DIAGNOSIS — F0390 Unspecified dementia without behavioral disturbance: Secondary | ICD-10-CM | POA: Diagnosis not present

## 2015-09-16 DIAGNOSIS — Z9181 History of falling: Secondary | ICD-10-CM | POA: Diagnosis not present

## 2015-09-19 DIAGNOSIS — Z9181 History of falling: Secondary | ICD-10-CM | POA: Diagnosis not present

## 2015-09-19 DIAGNOSIS — F0390 Unspecified dementia without behavioral disturbance: Secondary | ICD-10-CM | POA: Diagnosis not present

## 2015-09-19 DIAGNOSIS — Z87891 Personal history of nicotine dependence: Secondary | ICD-10-CM | POA: Diagnosis not present

## 2015-09-19 DIAGNOSIS — R41841 Cognitive communication deficit: Secondary | ICD-10-CM | POA: Diagnosis not present

## 2015-09-19 DIAGNOSIS — Z87820 Personal history of traumatic brain injury: Secondary | ICD-10-CM | POA: Diagnosis not present

## 2015-09-21 DIAGNOSIS — Z9181 History of falling: Secondary | ICD-10-CM | POA: Diagnosis not present

## 2015-09-21 DIAGNOSIS — Z87891 Personal history of nicotine dependence: Secondary | ICD-10-CM | POA: Diagnosis not present

## 2015-09-21 DIAGNOSIS — S06309D Unspecified focal traumatic brain injury with loss of consciousness of unspecified duration, subsequent encounter: Secondary | ICD-10-CM | POA: Diagnosis not present

## 2015-09-21 DIAGNOSIS — Z87820 Personal history of traumatic brain injury: Secondary | ICD-10-CM | POA: Diagnosis not present

## 2015-09-21 DIAGNOSIS — R41841 Cognitive communication deficit: Secondary | ICD-10-CM | POA: Diagnosis not present

## 2015-09-21 DIAGNOSIS — F0390 Unspecified dementia without behavioral disturbance: Secondary | ICD-10-CM | POA: Diagnosis not present

## 2015-09-23 DIAGNOSIS — R41841 Cognitive communication deficit: Secondary | ICD-10-CM | POA: Diagnosis not present

## 2015-09-23 DIAGNOSIS — F0390 Unspecified dementia without behavioral disturbance: Secondary | ICD-10-CM | POA: Diagnosis not present

## 2015-09-23 DIAGNOSIS — Z87891 Personal history of nicotine dependence: Secondary | ICD-10-CM | POA: Diagnosis not present

## 2015-09-23 DIAGNOSIS — Z87820 Personal history of traumatic brain injury: Secondary | ICD-10-CM | POA: Diagnosis not present

## 2015-09-23 DIAGNOSIS — Z9181 History of falling: Secondary | ICD-10-CM | POA: Diagnosis not present

## 2015-09-26 DIAGNOSIS — Z9181 History of falling: Secondary | ICD-10-CM | POA: Diagnosis not present

## 2015-09-26 DIAGNOSIS — R41841 Cognitive communication deficit: Secondary | ICD-10-CM | POA: Diagnosis not present

## 2015-09-26 DIAGNOSIS — Z87891 Personal history of nicotine dependence: Secondary | ICD-10-CM | POA: Diagnosis not present

## 2015-09-26 DIAGNOSIS — Z87820 Personal history of traumatic brain injury: Secondary | ICD-10-CM | POA: Diagnosis not present

## 2015-09-26 DIAGNOSIS — F0390 Unspecified dementia without behavioral disturbance: Secondary | ICD-10-CM | POA: Diagnosis not present

## 2015-09-28 DIAGNOSIS — Z87820 Personal history of traumatic brain injury: Secondary | ICD-10-CM | POA: Diagnosis not present

## 2015-09-28 DIAGNOSIS — Z87891 Personal history of nicotine dependence: Secondary | ICD-10-CM | POA: Diagnosis not present

## 2015-09-28 DIAGNOSIS — F0390 Unspecified dementia without behavioral disturbance: Secondary | ICD-10-CM | POA: Diagnosis not present

## 2015-09-28 DIAGNOSIS — R41841 Cognitive communication deficit: Secondary | ICD-10-CM | POA: Diagnosis not present

## 2015-09-28 DIAGNOSIS — Z9181 History of falling: Secondary | ICD-10-CM | POA: Diagnosis not present

## 2015-09-29 DIAGNOSIS — Z87820 Personal history of traumatic brain injury: Secondary | ICD-10-CM | POA: Diagnosis not present

## 2015-09-29 DIAGNOSIS — R41841 Cognitive communication deficit: Secondary | ICD-10-CM | POA: Diagnosis not present

## 2015-09-29 DIAGNOSIS — F0390 Unspecified dementia without behavioral disturbance: Secondary | ICD-10-CM | POA: Diagnosis not present

## 2015-09-29 DIAGNOSIS — Z9181 History of falling: Secondary | ICD-10-CM | POA: Diagnosis not present

## 2015-09-29 DIAGNOSIS — Z87891 Personal history of nicotine dependence: Secondary | ICD-10-CM | POA: Diagnosis not present

## 2015-10-04 DIAGNOSIS — Z9181 History of falling: Secondary | ICD-10-CM | POA: Diagnosis not present

## 2015-10-04 DIAGNOSIS — F0390 Unspecified dementia without behavioral disturbance: Secondary | ICD-10-CM | POA: Diagnosis not present

## 2015-10-04 DIAGNOSIS — R41841 Cognitive communication deficit: Secondary | ICD-10-CM | POA: Diagnosis not present

## 2015-10-04 DIAGNOSIS — Z87891 Personal history of nicotine dependence: Secondary | ICD-10-CM | POA: Diagnosis not present

## 2015-10-04 DIAGNOSIS — Z87820 Personal history of traumatic brain injury: Secondary | ICD-10-CM | POA: Diagnosis not present

## 2015-10-06 DIAGNOSIS — Z87891 Personal history of nicotine dependence: Secondary | ICD-10-CM | POA: Diagnosis not present

## 2015-10-06 DIAGNOSIS — R41841 Cognitive communication deficit: Secondary | ICD-10-CM | POA: Diagnosis not present

## 2015-10-06 DIAGNOSIS — F0390 Unspecified dementia without behavioral disturbance: Secondary | ICD-10-CM | POA: Diagnosis not present

## 2015-10-06 DIAGNOSIS — Z87820 Personal history of traumatic brain injury: Secondary | ICD-10-CM | POA: Diagnosis not present

## 2015-10-06 DIAGNOSIS — Z9181 History of falling: Secondary | ICD-10-CM | POA: Diagnosis not present

## 2015-10-07 DIAGNOSIS — M2010 Hallux valgus (acquired), unspecified foot: Secondary | ICD-10-CM | POA: Diagnosis not present

## 2015-10-07 DIAGNOSIS — L603 Nail dystrophy: Secondary | ICD-10-CM | POA: Diagnosis not present

## 2015-10-07 DIAGNOSIS — M2040 Other hammer toe(s) (acquired), unspecified foot: Secondary | ICD-10-CM | POA: Diagnosis not present

## 2015-10-07 DIAGNOSIS — B351 Tinea unguium: Secondary | ICD-10-CM | POA: Diagnosis not present

## 2015-10-07 DIAGNOSIS — Q845 Enlarged and hypertrophic nails: Secondary | ICD-10-CM | POA: Diagnosis not present

## 2015-10-10 DIAGNOSIS — Z9181 History of falling: Secondary | ICD-10-CM | POA: Diagnosis not present

## 2015-10-10 DIAGNOSIS — F0390 Unspecified dementia without behavioral disturbance: Secondary | ICD-10-CM | POA: Diagnosis not present

## 2015-10-10 DIAGNOSIS — R41841 Cognitive communication deficit: Secondary | ICD-10-CM | POA: Diagnosis not present

## 2015-10-10 DIAGNOSIS — Z87820 Personal history of traumatic brain injury: Secondary | ICD-10-CM | POA: Diagnosis not present

## 2015-10-10 DIAGNOSIS — Z87891 Personal history of nicotine dependence: Secondary | ICD-10-CM | POA: Diagnosis not present

## 2015-10-13 DIAGNOSIS — Z9181 History of falling: Secondary | ICD-10-CM | POA: Diagnosis not present

## 2015-10-13 DIAGNOSIS — R41841 Cognitive communication deficit: Secondary | ICD-10-CM | POA: Diagnosis not present

## 2015-10-13 DIAGNOSIS — F0390 Unspecified dementia without behavioral disturbance: Secondary | ICD-10-CM | POA: Diagnosis not present

## 2015-10-13 DIAGNOSIS — Z87891 Personal history of nicotine dependence: Secondary | ICD-10-CM | POA: Diagnosis not present

## 2015-10-13 DIAGNOSIS — Z87820 Personal history of traumatic brain injury: Secondary | ICD-10-CM | POA: Diagnosis not present

## 2015-10-14 DIAGNOSIS — D513 Other dietary vitamin B12 deficiency anemia: Secondary | ICD-10-CM | POA: Diagnosis not present

## 2015-10-14 DIAGNOSIS — S06309D Unspecified focal traumatic brain injury with loss of consciousness of unspecified duration, subsequent encounter: Secondary | ICD-10-CM | POA: Diagnosis not present

## 2015-10-14 DIAGNOSIS — M6281 Muscle weakness (generalized): Secondary | ICD-10-CM | POA: Diagnosis not present

## 2015-10-14 DIAGNOSIS — E559 Vitamin D deficiency, unspecified: Secondary | ICD-10-CM | POA: Diagnosis not present

## 2015-10-19 DIAGNOSIS — Z87820 Personal history of traumatic brain injury: Secondary | ICD-10-CM | POA: Diagnosis not present

## 2015-10-19 DIAGNOSIS — R41841 Cognitive communication deficit: Secondary | ICD-10-CM | POA: Diagnosis not present

## 2015-10-19 DIAGNOSIS — Z9181 History of falling: Secondary | ICD-10-CM | POA: Diagnosis not present

## 2015-10-19 DIAGNOSIS — Z87891 Personal history of nicotine dependence: Secondary | ICD-10-CM | POA: Diagnosis not present

## 2015-10-19 DIAGNOSIS — F0390 Unspecified dementia without behavioral disturbance: Secondary | ICD-10-CM | POA: Diagnosis not present

## 2015-10-26 DIAGNOSIS — Z87891 Personal history of nicotine dependence: Secondary | ICD-10-CM | POA: Diagnosis not present

## 2015-10-26 DIAGNOSIS — R41841 Cognitive communication deficit: Secondary | ICD-10-CM | POA: Diagnosis not present

## 2015-10-26 DIAGNOSIS — Z87820 Personal history of traumatic brain injury: Secondary | ICD-10-CM | POA: Diagnosis not present

## 2015-10-26 DIAGNOSIS — F0390 Unspecified dementia without behavioral disturbance: Secondary | ICD-10-CM | POA: Diagnosis not present

## 2015-10-26 DIAGNOSIS — Z9181 History of falling: Secondary | ICD-10-CM | POA: Diagnosis not present

## 2015-11-09 DIAGNOSIS — B351 Tinea unguium: Secondary | ICD-10-CM | POA: Diagnosis not present

## 2015-11-09 DIAGNOSIS — M79675 Pain in left toe(s): Secondary | ICD-10-CM | POA: Diagnosis not present

## 2015-11-09 DIAGNOSIS — M79674 Pain in right toe(s): Secondary | ICD-10-CM | POA: Diagnosis not present

## 2015-11-15 DIAGNOSIS — S06309D Unspecified focal traumatic brain injury with loss of consciousness of unspecified duration, subsequent encounter: Secondary | ICD-10-CM | POA: Diagnosis not present

## 2015-11-24 DIAGNOSIS — E559 Vitamin D deficiency, unspecified: Secondary | ICD-10-CM | POA: Diagnosis not present

## 2015-11-24 DIAGNOSIS — R600 Localized edema: Secondary | ICD-10-CM | POA: Diagnosis not present

## 2015-11-24 DIAGNOSIS — D513 Other dietary vitamin B12 deficiency anemia: Secondary | ICD-10-CM | POA: Diagnosis not present

## 2015-11-24 DIAGNOSIS — M6281 Muscle weakness (generalized): Secondary | ICD-10-CM | POA: Diagnosis not present

## 2015-12-29 DIAGNOSIS — E559 Vitamin D deficiency, unspecified: Secondary | ICD-10-CM | POA: Diagnosis not present

## 2015-12-29 DIAGNOSIS — D513 Other dietary vitamin B12 deficiency anemia: Secondary | ICD-10-CM | POA: Diagnosis not present

## 2016-01-12 DIAGNOSIS — I10 Essential (primary) hypertension: Secondary | ICD-10-CM | POA: Diagnosis not present

## 2016-01-12 DIAGNOSIS — E039 Hypothyroidism, unspecified: Secondary | ICD-10-CM | POA: Diagnosis not present

## 2016-01-12 DIAGNOSIS — E782 Mixed hyperlipidemia: Secondary | ICD-10-CM | POA: Diagnosis not present

## 2016-01-13 DIAGNOSIS — M6281 Muscle weakness (generalized): Secondary | ICD-10-CM | POA: Diagnosis not present

## 2016-01-13 DIAGNOSIS — E559 Vitamin D deficiency, unspecified: Secondary | ICD-10-CM | POA: Diagnosis not present

## 2016-01-13 DIAGNOSIS — D513 Other dietary vitamin B12 deficiency anemia: Secondary | ICD-10-CM | POA: Diagnosis not present

## 2016-01-24 DIAGNOSIS — S06309D Unspecified focal traumatic brain injury with loss of consciousness of unspecified duration, subsequent encounter: Secondary | ICD-10-CM | POA: Diagnosis not present

## 2016-03-12 DIAGNOSIS — M6281 Muscle weakness (generalized): Secondary | ICD-10-CM | POA: Diagnosis not present

## 2016-03-12 DIAGNOSIS — E559 Vitamin D deficiency, unspecified: Secondary | ICD-10-CM | POA: Diagnosis not present

## 2016-03-12 DIAGNOSIS — R600 Localized edema: Secondary | ICD-10-CM | POA: Diagnosis not present

## 2016-03-12 DIAGNOSIS — D513 Other dietary vitamin B12 deficiency anemia: Secondary | ICD-10-CM | POA: Diagnosis not present

## 2016-04-17 DIAGNOSIS — S06309D Unspecified focal traumatic brain injury with loss of consciousness of unspecified duration, subsequent encounter: Secondary | ICD-10-CM | POA: Diagnosis not present

## 2016-04-18 DIAGNOSIS — M79675 Pain in left toe(s): Secondary | ICD-10-CM | POA: Diagnosis not present

## 2016-04-18 DIAGNOSIS — M79674 Pain in right toe(s): Secondary | ICD-10-CM | POA: Diagnosis not present

## 2016-04-18 DIAGNOSIS — B351 Tinea unguium: Secondary | ICD-10-CM | POA: Diagnosis not present

## 2016-06-21 DIAGNOSIS — M6281 Muscle weakness (generalized): Secondary | ICD-10-CM | POA: Diagnosis not present

## 2016-06-21 DIAGNOSIS — E559 Vitamin D deficiency, unspecified: Secondary | ICD-10-CM | POA: Diagnosis not present

## 2016-06-21 DIAGNOSIS — D513 Other dietary vitamin B12 deficiency anemia: Secondary | ICD-10-CM | POA: Diagnosis not present

## 2016-06-21 DIAGNOSIS — R600 Localized edema: Secondary | ICD-10-CM | POA: Diagnosis not present

## 2016-06-26 DIAGNOSIS — S06309D Unspecified focal traumatic brain injury with loss of consciousness of unspecified duration, subsequent encounter: Secondary | ICD-10-CM | POA: Diagnosis not present

## 2016-07-04 DIAGNOSIS — M79675 Pain in left toe(s): Secondary | ICD-10-CM | POA: Diagnosis not present

## 2016-07-04 DIAGNOSIS — M79674 Pain in right toe(s): Secondary | ICD-10-CM | POA: Diagnosis not present

## 2016-07-04 DIAGNOSIS — B351 Tinea unguium: Secondary | ICD-10-CM | POA: Diagnosis not present

## 2016-07-19 DIAGNOSIS — M6281 Muscle weakness (generalized): Secondary | ICD-10-CM | POA: Diagnosis not present

## 2016-07-19 DIAGNOSIS — Z0000 Encounter for general adult medical examination without abnormal findings: Secondary | ICD-10-CM | POA: Diagnosis not present

## 2016-07-26 DIAGNOSIS — Z0000 Encounter for general adult medical examination without abnormal findings: Secondary | ICD-10-CM | POA: Diagnosis not present

## 2016-08-22 DIAGNOSIS — S06309D Unspecified focal traumatic brain injury with loss of consciousness of unspecified duration, subsequent encounter: Secondary | ICD-10-CM | POA: Diagnosis not present

## 2016-09-19 DIAGNOSIS — M79675 Pain in left toe(s): Secondary | ICD-10-CM | POA: Diagnosis not present

## 2016-09-19 DIAGNOSIS — M79674 Pain in right toe(s): Secondary | ICD-10-CM | POA: Diagnosis not present

## 2016-09-19 DIAGNOSIS — B351 Tinea unguium: Secondary | ICD-10-CM | POA: Diagnosis not present

## 2016-11-09 DIAGNOSIS — E119 Type 2 diabetes mellitus without complications: Secondary | ICD-10-CM | POA: Diagnosis not present

## 2016-11-09 DIAGNOSIS — R600 Localized edema: Secondary | ICD-10-CM | POA: Diagnosis not present

## 2016-11-09 DIAGNOSIS — M6281 Muscle weakness (generalized): Secondary | ICD-10-CM | POA: Diagnosis not present

## 2016-11-09 DIAGNOSIS — E785 Hyperlipidemia, unspecified: Secondary | ICD-10-CM | POA: Diagnosis not present

## 2016-11-09 DIAGNOSIS — Z0000 Encounter for general adult medical examination without abnormal findings: Secondary | ICD-10-CM | POA: Diagnosis not present

## 2016-11-21 DIAGNOSIS — S06309D Unspecified focal traumatic brain injury with loss of consciousness of unspecified duration, subsequent encounter: Secondary | ICD-10-CM | POA: Diagnosis not present

## 2016-12-05 DIAGNOSIS — M79674 Pain in right toe(s): Secondary | ICD-10-CM | POA: Diagnosis not present

## 2016-12-05 DIAGNOSIS — M79675 Pain in left toe(s): Secondary | ICD-10-CM | POA: Diagnosis not present

## 2016-12-05 DIAGNOSIS — B351 Tinea unguium: Secondary | ICD-10-CM | POA: Diagnosis not present

## 2017-01-03 DIAGNOSIS — R627 Adult failure to thrive: Secondary | ICD-10-CM | POA: Diagnosis not present

## 2017-01-03 DIAGNOSIS — M6281 Muscle weakness (generalized): Secondary | ICD-10-CM | POA: Diagnosis not present

## 2017-01-18 ENCOUNTER — Encounter: Payer: Self-pay | Admitting: Family

## 2017-01-29 ENCOUNTER — Ambulatory Visit: Payer: Medicare Other | Admitting: Family

## 2017-02-04 ENCOUNTER — Encounter: Payer: Self-pay | Admitting: Family

## 2017-02-04 ENCOUNTER — Ambulatory Visit (INDEPENDENT_AMBULATORY_CARE_PROVIDER_SITE_OTHER): Payer: Medicare Other | Admitting: Family

## 2017-02-04 VITALS — BP 97/65 | HR 70 | Temp 97.6°F | Resp 16 | Ht 64.0 in | Wt 132.0 lb

## 2017-02-04 DIAGNOSIS — S2500XD Unspecified injury of thoracic aorta, subsequent encounter: Secondary | ICD-10-CM | POA: Diagnosis not present
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
## 2017-02-07 ENCOUNTER — Ambulatory Visit
Admission: RE | Admit: 2017-02-07 | Discharge: 2017-02-07 | Disposition: A | Discharge status: Home / Self Care | Payer: Medicare Other | Source: Ambulatory Visit | Attending: Vascular Surgery | Admitting: Vascular Surgery

## 2017-02-07 DIAGNOSIS — S2500XA Unspecified injury of thoracic aorta, initial encounter: Secondary | ICD-10-CM | POA: Diagnosis not present

## 2017-02-07 DIAGNOSIS — S2500XD Unspecified injury of thoracic aorta, subsequent encounter: Secondary | ICD-10-CM

## 2017-02-07 MED ORDER — IOPAMIDOL (ISOVUE-370) INJECTION 76%
75.0000 mL | Freq: Once | INTRAVENOUS | Status: AC | PRN
  Administered 2017-02-07: 75 mL via INTRAVENOUS

## 2017-02-14 DIAGNOSIS — M2010 Hallux valgus (acquired), unspecified foot: Secondary | ICD-10-CM | POA: Diagnosis not present

## 2017-02-14 DIAGNOSIS — I739 Peripheral vascular disease, unspecified: Secondary | ICD-10-CM | POA: Diagnosis not present

## 2017-02-14 DIAGNOSIS — B351 Tinea unguium: Secondary | ICD-10-CM | POA: Diagnosis not present

## 2017-02-14 DIAGNOSIS — L603 Nail dystrophy: Secondary | ICD-10-CM | POA: Diagnosis not present

## 2017-02-14 DIAGNOSIS — Q845 Enlarged and hypertrophic nails: Secondary | ICD-10-CM | POA: Diagnosis not present

## 2017-02-15 DIAGNOSIS — M546 Pain in thoracic spine: Secondary | ICD-10-CM | POA: Diagnosis not present

## 2017-02-15 DIAGNOSIS — M545 Low back pain: Secondary | ICD-10-CM | POA: Diagnosis not present

## 2017-02-15 DIAGNOSIS — M6281 Muscle weakness (generalized): Secondary | ICD-10-CM | POA: Diagnosis not present

## 2017-02-15 DIAGNOSIS — M5440 Lumbago with sciatica, unspecified side: Secondary | ICD-10-CM | POA: Diagnosis not present

## 2017-02-16 DIAGNOSIS — R300 Dysuria: Secondary | ICD-10-CM | POA: Diagnosis not present

## 2017-02-16 DIAGNOSIS — M545 Low back pain: Secondary | ICD-10-CM | POA: Diagnosis not present

## 2017-02-21 DIAGNOSIS — M545 Low back pain: Secondary | ICD-10-CM | POA: Diagnosis not present

## 2017-02-21 DIAGNOSIS — Z0000 Encounter for general adult medical examination without abnormal findings: Secondary | ICD-10-CM | POA: Diagnosis not present

## 2017-02-25 ENCOUNTER — Encounter: Payer: Self-pay | Admitting: Vascular Surgery

## 2017-02-27 DIAGNOSIS — B351 Tinea unguium: Secondary | ICD-10-CM | POA: Diagnosis not present

## 2017-02-27 DIAGNOSIS — M79674 Pain in right toe(s): Secondary | ICD-10-CM | POA: Diagnosis not present

## 2017-02-27 DIAGNOSIS — M79675 Pain in left toe(s): Secondary | ICD-10-CM | POA: Diagnosis not present

## 2017-02-28 DIAGNOSIS — Z0000 Encounter for general adult medical examination without abnormal findings: Secondary | ICD-10-CM | POA: Diagnosis not present

## 2017-02-28 DIAGNOSIS — I10 Essential (primary) hypertension: Secondary | ICD-10-CM | POA: Diagnosis not present

## 2017-02-28 DIAGNOSIS — R627 Adult failure to thrive: Secondary | ICD-10-CM | POA: Diagnosis not present

## 2017-02-28 DIAGNOSIS — E785 Hyperlipidemia, unspecified: Secondary | ICD-10-CM | POA: Diagnosis not present

## 2017-02-28 DIAGNOSIS — S06309D Unspecified focal traumatic brain injury with loss of consciousness of unspecified duration, subsequent encounter: Secondary | ICD-10-CM | POA: Diagnosis not present

## 2017-02-28 DIAGNOSIS — E039 Hypothyroidism, unspecified: Secondary | ICD-10-CM | POA: Diagnosis not present

## 2017-02-28 DIAGNOSIS — E119 Type 2 diabetes mellitus without complications: Secondary | ICD-10-CM | POA: Diagnosis not present

## 2017-03-05 ENCOUNTER — Ambulatory Visit (INDEPENDENT_AMBULATORY_CARE_PROVIDER_SITE_OTHER): Payer: Medicare Other | Admitting: Vascular Surgery

## 2017-03-05 ENCOUNTER — Encounter: Payer: Self-pay | Admitting: Vascular Surgery

## 2017-03-05 VITALS — BP 126/78 | HR 89 | Resp 83 | Ht 64.0 in | Wt 119.0 lb

## 2017-03-05 DIAGNOSIS — S2500XD Unspecified injury of thoracic aorta, subsequent encounter: Secondary | ICD-10-CM | POA: Diagnosis not present
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
## 2017-03-12 ENCOUNTER — Ambulatory Visit: Payer: Self-pay | Admitting: Vascular Surgery

## 2017-04-02 ENCOUNTER — Ambulatory Visit: Payer: Self-pay | Admitting: Vascular Surgery

## 2018-01-28 ENCOUNTER — Emergency Department (HOSPITAL_COMMUNITY): Payer: Medicare Other

## 2018-01-28 ENCOUNTER — Encounter (HOSPITAL_COMMUNITY): Payer: Self-pay | Admitting: Family Medicine

## 2018-01-28 ENCOUNTER — Emergency Department (HOSPITAL_COMMUNITY)
Admission: EM | Admit: 2018-01-28 | Discharge: 2018-01-28 | Disposition: A | Discharge status: Home / Self Care | Payer: Medicare Other | Attending: Emergency Medicine | Admitting: Emergency Medicine

## 2018-01-28 ENCOUNTER — Other Ambulatory Visit: Payer: Self-pay

## 2018-01-28 DIAGNOSIS — Z87891 Personal history of nicotine dependence: Secondary | ICD-10-CM | POA: Insufficient documentation

## 2018-01-28 DIAGNOSIS — F0390 Unspecified dementia without behavioral disturbance: Secondary | ICD-10-CM | POA: Insufficient documentation

## 2018-01-28 DIAGNOSIS — S51812A Laceration without foreign body of left forearm, initial encounter: Secondary | ICD-10-CM | POA: Diagnosis present

## 2018-01-28 DIAGNOSIS — Y939 Activity, unspecified: Secondary | ICD-10-CM | POA: Insufficient documentation

## 2018-01-28 DIAGNOSIS — Y929 Unspecified place or not applicable: Secondary | ICD-10-CM | POA: Diagnosis not present

## 2018-01-28 DIAGNOSIS — Z7982 Long term (current) use of aspirin: Secondary | ICD-10-CM | POA: Insufficient documentation

## 2018-01-28 DIAGNOSIS — Y999 Unspecified external cause status: Secondary | ICD-10-CM | POA: Insufficient documentation

## 2018-01-28 DIAGNOSIS — Z79899 Other long term (current) drug therapy: Secondary | ICD-10-CM | POA: Insufficient documentation

## 2018-01-28 DIAGNOSIS — S5012XA Contusion of left forearm, initial encounter: Secondary | ICD-10-CM

## 2018-01-28 DIAGNOSIS — Z23 Encounter for immunization: Secondary | ICD-10-CM | POA: Diagnosis not present

## 2018-01-28 HISTORY — DX: Unspecified dementia, unspecified severity, without behavioral disturbance, psychotic disturbance, mood disturbance, and anxiety: F03.90

## 2018-01-28 MED ORDER — TETANUS-DIPHTH-ACELL PERTUSSIS 5-2.5-18.5 LF-MCG/0.5 IM SUSP
0.5000 mL | Freq: Once | INTRAMUSCULAR | Status: AC
  Administered 2018-01-28: 0.5 mL via INTRAMUSCULAR
  Filled 2018-01-28: qty 0.5

## 2018-01-28 MED ORDER — LIDOCAINE-EPINEPHRINE (PF) 2 %-1:200000 IJ SOLN
20.0000 mL | Freq: Once | INTRAMUSCULAR | Status: AC
  Administered 2018-01-28: 20 mL
  Filled 2018-01-28: qty 20

## 2018-01-28 NOTE — ED Notes (Signed)
Wound cleansed , dressing applied,  instructions to CG . Verbalized and demonstrated understaning to wound care/.
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
## 2018-01-28 NOTE — Discharge Instructions (Addendum)
Keep wound clean with mild soap and water. Keep area covered with a topical antibiotic ointment and bandage, keep bandage dry, and do not submerge in water for 24 hours. Ice and elevate for additional pain and swelling relief. Alternate between Ibuprofen and Tylenol for additional pain relief. Follow up with your primary care doctor or the [facility] in approximately 3 days for wound recheck and again in 7-10 days for recheck and suture removal. Monitor area for signs of infection to include, but not limited to: increasing pain, spreading redness, drainage/pus, worsening swelling, or fevers. Return to emergency department for emergent changing or worsening symptoms. ° °  °

## 2018-08-31 ENCOUNTER — Emergency Department (HOSPITAL_COMMUNITY): Payer: Medicare Other

## 2018-08-31 ENCOUNTER — Encounter (HOSPITAL_COMMUNITY): Payer: Self-pay | Admitting: Emergency Medicine

## 2018-08-31 ENCOUNTER — Emergency Department (HOSPITAL_COMMUNITY)
Admission: EM | Admit: 2018-08-31 | Discharge: 2018-09-01 | Disposition: A | Discharge status: Home / Self Care | Payer: Medicare Other | Source: Home / Self Care | Attending: Emergency Medicine | Admitting: Emergency Medicine

## 2018-08-31 ENCOUNTER — Emergency Department (HOSPITAL_COMMUNITY)
Admission: EM | Admit: 2018-08-31 | Discharge: 2018-08-31 | Disposition: A | Discharge status: Home / Self Care | Payer: Medicare Other | Attending: Emergency Medicine | Admitting: Emergency Medicine

## 2018-08-31 ENCOUNTER — Other Ambulatory Visit: Payer: Self-pay

## 2018-08-31 DIAGNOSIS — F0390 Unspecified dementia without behavioral disturbance: Secondary | ICD-10-CM | POA: Insufficient documentation

## 2018-08-31 DIAGNOSIS — J189 Pneumonia, unspecified organism: Secondary | ICD-10-CM

## 2018-08-31 DIAGNOSIS — J181 Lobar pneumonia, unspecified organism: Secondary | ICD-10-CM

## 2018-08-31 DIAGNOSIS — R059 Cough, unspecified: Secondary | ICD-10-CM

## 2018-08-31 DIAGNOSIS — R0602 Shortness of breath: Secondary | ICD-10-CM | POA: Diagnosis present

## 2018-08-31 DIAGNOSIS — Z87891 Personal history of nicotine dependence: Secondary | ICD-10-CM

## 2018-08-31 DIAGNOSIS — Z79899 Other long term (current) drug therapy: Secondary | ICD-10-CM | POA: Diagnosis not present

## 2018-08-31 DIAGNOSIS — Z7982 Long term (current) use of aspirin: Secondary | ICD-10-CM

## 2018-08-31 DIAGNOSIS — R05 Cough: Secondary | ICD-10-CM

## 2018-08-31 LAB — BASIC METABOLIC PANEL
ANION GAP: 8 (ref 5–15)
BUN: 10 mg/dL (ref 8–23)
CALCIUM: 8.7 mg/dL — AB (ref 8.9–10.3)
CO2: 30 mmol/L (ref 22–32)
Chloride: 101 mmol/L (ref 98–111)
Creatinine, Ser: 0.89 mg/dL (ref 0.61–1.24)
GFR calc Af Amer: >60 mL/min (ref >60)
GLUCOSE: 87 mg/dL (ref 70–99)
Potassium: 3.8 mmol/L (ref 3.5–5.1)
Sodium: 139 mmol/L (ref 135–145)

## 2018-08-31 LAB — CBC WITH DIFFERENTIAL/PLATELET
Abs Immature Granulocytes: 0.05 10*3/uL (ref 0.00–0.07)
BASOS PCT: 0 %
Basophils Absolute: 0 10*3/uL (ref 0.0–0.1)
Eosinophils Absolute: 0.4 10*3/uL (ref 0.0–0.5)
Eosinophils Relative: 3 %
HCT: 39.9 % (ref 39.0–52.0)
Hemoglobin: 12.7 g/dL — ABNORMAL LOW (ref 13.0–17.0)
IMMATURE GRANULOCYTES: 0 %
Lymphocytes Relative: 18 %
Lymphs Abs: 2.1 10*3/uL (ref 0.7–4.0)
MCH: 31.6 pg (ref 26.0–34.0)
MCHC: 31.8 g/dL (ref 30.0–36.0)
MCV: 99.3 fL (ref 80.0–100.0)
MONO ABS: 1.1 10*3/uL — AB (ref 0.1–1.0)
MONOS PCT: 9 %
NEUTROS PCT: 70 %
Neutro Abs: 8.3 10*3/uL — ABNORMAL HIGH (ref 1.7–7.7)
PLATELETS: 279 10*3/uL (ref 150–400)
RBC: 4.02 MIL/uL — ABNORMAL LOW (ref 4.22–5.81)
RDW: 12.2 % (ref 11.5–15.5)
WBC: 12 10*3/uL — ABNORMAL HIGH (ref 4.0–10.5)
nRBC: 0 % (ref 0.0–0.2)

## 2018-08-31 LAB — I-STAT TROPONIN, ED: Troponin i, poc: 0 ng/mL (ref 0.00–0.08)

## 2018-08-31 LAB — BRAIN NATRIURETIC PEPTIDE: B Natriuretic Peptide: 81.6 pg/mL (ref 0.0–100.0)

## 2018-08-31 MED ORDER — ALBUTEROL SULFATE HFA 108 (90 BASE) MCG/ACT IN AERS
1.0000 | INHALATION_SPRAY | Freq: Once | RESPIRATORY_TRACT | Status: AC
  Administered 2018-08-31: 1 via RESPIRATORY_TRACT
  Filled 2018-08-31: qty 6.7

## 2018-08-31 MED ORDER — DOXYCYCLINE HYCLATE 100 MG PO TABS
100.0000 mg | ORAL_TABLET | Freq: Once | ORAL | Status: AC
  Administered 2018-08-31: 100 mg via ORAL
  Filled 2018-08-31: qty 1

## 2018-08-31 MED ORDER — ALBUTEROL SULFATE (2.5 MG/3ML) 0.083% IN NEBU
5.0000 mg | INHALATION_SOLUTION | Freq: Once | RESPIRATORY_TRACT | Status: AC
  Administered 2018-08-31: 5 mg via RESPIRATORY_TRACT
  Filled 2018-08-31: qty 6

## 2018-08-31 MED ORDER — BENZONATATE 100 MG PO CAPS: 200.0000 mg | ORAL_CAPSULE | Freq: Three times a day (TID) | ORAL | 0 refills | Status: DC

## 2018-08-31 MED ORDER — DOXYCYCLINE HYCLATE 100 MG PO CAPS: 100.0000 mg | ORAL_CAPSULE | Freq: Two times a day (BID) | ORAL | 0 refills | Status: AC

## 2018-08-31 MED ORDER — AEROCHAMBER PLUS FLO-VU MEDIUM MISC
1.0000 | Freq: Once | Status: AC
  Administered 2018-09-01: 1
  Filled 2018-08-31: qty 1

## 2018-08-31 MED ORDER — ALBUTEROL SULFATE (5 MG/ML) 0.5% IN NEBU: 2.5000 mg | INHALATION_SOLUTION | Freq: Four times a day (QID) | RESPIRATORY_TRACT | 12 refills | Status: DC | PRN

## 2018-08-31 MED ORDER — ALBUTEROL SULFATE HFA 108 (90 BASE) MCG/ACT IN AERS
2.0000 | INHALATION_SPRAY | RESPIRATORY_TRACT | Status: DC | PRN
  Filled 2018-08-31: qty 6.7

## 2018-08-31 NOTE — ED Triage Notes (Signed)
Patient states he was diagnosed with pneumonia. Patient is not able to use the inhaler. Daughter thinks that he is getting worse because he can not use the inhaler right.
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
## 2018-08-31 NOTE — ED Notes (Signed)
Pt is not in any respiratory distress at this time.  
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
## 2018-08-31 NOTE — Discharge Instructions (Addendum)
1. Medications: albuterol, usual home medications 2. Treatment: rest, drink plenty of fluids,  3. Follow Up: Please followup with your primary doctor Monday morning for discussion of your diagnoses and further evaluation after today's visit; Please return to the ER for difficulty breathing, high fevers or worsening symptoms.

## 2018-08-31 NOTE — ED Triage Notes (Signed)
Pt with recent shortness of breath, productive cough and wheezing x several days. 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 Pt with crackles in RLL, no wheezing in triage.

## 2018-08-31 NOTE — ED Notes (Signed)
D/c paperwork reviewed with pt and pts daughter.  Daughter verbalized understanding, had no questions.  Pt d/c via wheelchair.

## 2018-08-31 NOTE — ED Provider Notes (Signed)
Medical screening examination/treatment/procedure(s) were conducted as a shared visit with non-physician practitioner(s) and myself.  I personally evaluated the patient during the encounter.  None   87yM with cough. Seen in ED earlier for the same and diagnosed with CAP. R sided infiltrate. He appears well on my exam. He is coughing but work of breathing is not increased. He is very pleasant and joking around quite a bit. He does not appear dehydrated. Labs and imaging from earlier were reviewed and are largely reassuring.  Daughter brought him back because she is very concerned and feels like he needs to be admitted. Her concerns are legitimate but I do not feel there would a be a clear medical benefit in admission. Aside from his age there really isn't anything that places him at higher risk. Many of the things she is concerned about (fatigue, lack of appetite) are expected and not going to go away simply because he is admitted. Admission is not w/o potential risk as well, particularly in flu season. Will continue outpt treatment at this time. Re-iterated return precautions.      Raeford Razor, MD 08/31/18 2352

## 2018-08-31 NOTE — Discharge Instructions (Addendum)
Take the doxycycline as directed. You will need to follow-up with your primary care provider. Return to the ED for worsening symptoms, increased chest pain, shortness of breath, fever.

## 2019-07-22 ENCOUNTER — Inpatient Hospital Stay (HOSPITAL_COMMUNITY)
Admission: EM | Admit: 2019-07-22 | Discharge: 2019-07-26 | DRG: 177 | Disposition: A | Discharge status: Home Health | Payer: Medicare Other | Attending: Internal Medicine | Admitting: Internal Medicine

## 2019-07-22 ENCOUNTER — Other Ambulatory Visit: Payer: Self-pay

## 2019-07-22 ENCOUNTER — Emergency Department (HOSPITAL_COMMUNITY): Payer: Medicare Other

## 2019-07-22 ENCOUNTER — Encounter (HOSPITAL_COMMUNITY): Payer: Self-pay | Admitting: Emergency Medicine

## 2019-07-22 DIAGNOSIS — T7840XS Allergy, unspecified, sequela: Secondary | ICD-10-CM

## 2019-07-22 DIAGNOSIS — E860 Dehydration: Secondary | ICD-10-CM | POA: Diagnosis not present

## 2019-07-22 DIAGNOSIS — J1282 Pneumonia due to coronavirus disease 2019: Secondary | ICD-10-CM | POA: Diagnosis present

## 2019-07-22 DIAGNOSIS — Z66 Do not resuscitate: Secondary | ICD-10-CM | POA: Diagnosis present

## 2019-07-22 DIAGNOSIS — F0390 Unspecified dementia without behavioral disturbance: Secondary | ICD-10-CM | POA: Diagnosis present

## 2019-07-22 DIAGNOSIS — Z7982 Long term (current) use of aspirin: Secondary | ICD-10-CM

## 2019-07-22 DIAGNOSIS — G301 Alzheimer's disease with late onset: Secondary | ICD-10-CM | POA: Diagnosis not present

## 2019-07-22 DIAGNOSIS — U071 COVID-19: Secondary | ICD-10-CM

## 2019-07-22 DIAGNOSIS — F0280 Dementia in other diseases classified elsewhere without behavioral disturbance: Secondary | ICD-10-CM

## 2019-07-22 DIAGNOSIS — R531 Weakness: Secondary | ICD-10-CM

## 2019-07-22 DIAGNOSIS — Z87891 Personal history of nicotine dependence: Secondary | ICD-10-CM

## 2019-07-22 LAB — CBC WITH DIFFERENTIAL/PLATELET
Abs Immature Granulocytes: 0.01 10*3/uL (ref 0.00–0.07)
Basophils Absolute: 0 10*3/uL (ref 0.0–0.1)
Basophils Relative: 0 %
Eosinophils Absolute: 0 10*3/uL (ref 0.0–0.5)
Eosinophils Relative: 0 %
HCT: 38.7 % — ABNORMAL LOW (ref 39.0–52.0)
Hemoglobin: 12.4 g/dL — ABNORMAL LOW (ref 13.0–17.0)
Immature Granulocytes: 0 %
Lymphocytes Relative: 37 %
Lymphs Abs: 1.9 10*3/uL (ref 0.7–4.0)
MCH: 31 pg (ref 26.0–34.0)
MCHC: 32 g/dL (ref 30.0–36.0)
MCV: 96.8 fL (ref 80.0–100.0)
Monocytes Absolute: 0.7 10*3/uL (ref 0.1–1.0)
Monocytes Relative: 14 %
Neutro Abs: 2.5 10*3/uL (ref 1.7–7.7)
Neutrophils Relative %: 49 %
Platelets: 209 10*3/uL (ref 150–400)
RBC: 4 MIL/uL — ABNORMAL LOW (ref 4.22–5.81)
RDW: 12.2 % (ref 11.5–15.5)
WBC: 5.2 10*3/uL (ref 4.0–10.5)
nRBC: 0 % (ref 0.0–0.2)

## 2019-07-22 LAB — URINALYSIS, ROUTINE W REFLEX MICROSCOPIC
Bilirubin Urine: NEGATIVE
Glucose, UA: NEGATIVE mg/dL
Ketones, ur: NEGATIVE mg/dL
Leukocytes,Ua: NEGATIVE
Nitrite: NEGATIVE
Protein, ur: 30 mg/dL — AB
Specific Gravity, Urine: 1.02 (ref 1.005–1.030)
pH: 5 (ref 5.0–8.0)

## 2019-07-22 LAB — BASIC METABOLIC PANEL
Anion gap: 7 (ref 5–15)
BUN: 20 mg/dL (ref 8–23)
CO2: 28 mmol/L (ref 22–32)
Calcium: 8.3 mg/dL — ABNORMAL LOW (ref 8.9–10.3)
Chloride: 104 mmol/L (ref 98–111)
Creatinine, Ser: 0.97 mg/dL (ref 0.61–1.24)
GFR calc Af Amer: >60 mL/min (ref >60)
GFR calc non Af Amer: >60 mL/min (ref >60)
Glucose, Bld: 113 mg/dL — ABNORMAL HIGH (ref 70–99)
Potassium: 4.1 mmol/L (ref 3.5–5.1)
Sodium: 139 mmol/L (ref 135–145)

## 2019-07-22 LAB — LACTATE DEHYDROGENASE: LDH: 178 U/L (ref 98–192)

## 2019-07-22 LAB — POC SARS CORONAVIRUS 2 AG -  ED
SARS Coronavirus 2 Ag: POSITIVE — AB
SARS Coronavirus 2 Ag: POSITIVE — AB

## 2019-07-22 LAB — ABO/RH: ABO/RH(D): B POS

## 2019-07-22 MED ORDER — SODIUM CHLORIDE 0.9 % IV SOLN: INTRAVENOUS | Status: DC

## 2019-07-22 MED ORDER — ASPIRIN EC 81 MG PO TBEC
81.0000 mg | DELAYED_RELEASE_TABLET | ORAL | Status: DC
  Administered 2019-07-22: 81 mg via ORAL
  Filled 2019-07-22: qty 1

## 2019-07-22 MED ORDER — OXYCODONE HCL 5 MG PO TABS
5.0000 mg | ORAL_TABLET | ORAL | Status: DC | PRN
  Administered 2019-07-22: 5 mg via ORAL
  Filled 2019-07-22: qty 1

## 2019-07-22 MED ORDER — BENZONATATE 100 MG PO CAPS
200.0000 mg | ORAL_CAPSULE | Freq: Three times a day (TID) | ORAL | Status: DC
  Administered 2019-07-22 – 2019-07-26 (×11): 200 mg via ORAL
  Filled 2019-07-22 (×11): qty 2

## 2019-07-22 MED ORDER — LACTATED RINGERS IV BOLUS
1000.0000 mL | Freq: Once | INTRAVENOUS | Status: AC
  Administered 2019-07-22: 13:00:00 1000 mL via INTRAVENOUS

## 2019-07-22 MED ORDER — SODIUM CHLORIDE 0.9 % IV SOLN
200.0000 mg | Freq: Once | INTRAVENOUS | Status: AC
  Administered 2019-07-22: 200 mg via INTRAVENOUS
  Filled 2019-07-22: qty 200

## 2019-07-22 MED ORDER — ACETAMINOPHEN 325 MG PO TABS: 650.0000 mg | ORAL_TABLET | Freq: Four times a day (QID) | ORAL | Status: DC | PRN

## 2019-07-22 MED ORDER — DEXAMETHASONE SODIUM PHOSPHATE 10 MG/ML IJ SOLN
6.0000 mg | INTRAMUSCULAR | Status: DC
  Administered 2019-07-22: 15:00:00 6 mg via INTRAVENOUS
  Filled 2019-07-22: qty 1

## 2019-07-22 MED ORDER — ENOXAPARIN SODIUM 40 MG/0.4ML ~~LOC~~ SOLN
40.0000 mg | SUBCUTANEOUS | Status: DC
  Administered 2019-07-22 – 2019-07-25 (×4): 40 mg via SUBCUTANEOUS
  Filled 2019-07-22 (×4): qty 0.4

## 2019-07-22 MED ORDER — LORATADINE 10 MG PO TABS
10.0000 mg | ORAL_TABLET | Freq: Every day | ORAL | Status: DC
  Administered 2019-07-22 – 2019-07-26 (×5): 10 mg via ORAL
  Filled 2019-07-22 (×5): qty 1

## 2019-07-22 MED ORDER — MAGNESIUM SULFATE 2 GM/50ML IV SOLN
2.0000 g | Freq: Once | INTRAVENOUS | Status: AC
  Administered 2019-07-22: 17:00:00 2 g via INTRAVENOUS
  Filled 2019-07-22: qty 50

## 2019-07-22 MED ORDER — ALBUTEROL SULFATE HFA 108 (90 BASE) MCG/ACT IN AERS
2.0000 | INHALATION_SPRAY | Freq: Four times a day (QID) | RESPIRATORY_TRACT | Status: DC | PRN
  Filled 2019-07-22: qty 6.7

## 2019-07-22 MED ORDER — SODIUM CHLORIDE 0.9 % IV SOLN
100.0000 mg | Freq: Every day | INTRAVENOUS | Status: AC
  Administered 2019-07-23 – 2019-07-26 (×4): 100 mg via INTRAVENOUS
  Filled 2019-07-22 (×5): qty 20

## 2019-07-22 MED ORDER — ONDANSETRON HCL 4 MG PO TABS: 4.0000 mg | ORAL_TABLET | Freq: Four times a day (QID) | ORAL | Status: DC | PRN

## 2019-07-22 MED ORDER — BENZONATATE 100 MG PO CAPS
200.0000 mg | ORAL_CAPSULE | Freq: Three times a day (TID) | ORAL | Status: DC
  Administered 2019-07-22: 15:00:00 200 mg via ORAL
  Filled 2019-07-22: qty 2

## 2019-07-22 MED ORDER — ONDANSETRON HCL 4 MG/2ML IJ SOLN
4.0000 mg | Freq: Four times a day (QID) | INTRAMUSCULAR | Status: DC | PRN
  Filled 2019-07-22: qty 2

## 2019-07-22 NOTE — ED Triage Notes (Signed)
Arrives via personal car, patient is baseline demented, brought in by daughter. He has been falling recently and needs help to the RR. Unable to urinate over the past 12 hours, daughter reports that he also dumps out the water she gives him. She thinks he is dehydrated and has a UTI. Patient has no complaints.

## 2019-07-22 NOTE — ED Notes (Signed)
Bladder scan x3, got 277 mL, 263 and 265 mL urine on scan.

## 2019-07-22 NOTE — ED Notes (Signed)
Pt assisted using the BSC. Pt had a moderated bowel movement. Pt stated he is sore, pt was informed he does have pain medication I can give him if he wants it. Pt stated, "sure, lets try it."
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
## 2019-07-22 NOTE — ED Notes (Signed)
URINE CULTURE SENT TO LAB WITH URINE SAMPLE 

## 2019-07-22 NOTE — ED Notes (Signed)
Pt found sitting at the end of the bed, removed all monitoring equipment and had pulled IV out. Pt was redirected back into bed. New linens placed on bed. Equipment reapplied to pt. Pt assisted with using the urinal. Pt is now resting on right side.

## 2019-07-23 DIAGNOSIS — U071 COVID-19: Secondary | ICD-10-CM | POA: Diagnosis present

## 2019-07-23 DIAGNOSIS — Z87891 Personal history of nicotine dependence: Secondary | ICD-10-CM | POA: Diagnosis not present

## 2019-07-23 DIAGNOSIS — Z66 Do not resuscitate: Secondary | ICD-10-CM | POA: Diagnosis present

## 2019-07-23 DIAGNOSIS — Z7982 Long term (current) use of aspirin: Secondary | ICD-10-CM | POA: Diagnosis not present

## 2019-07-23 DIAGNOSIS — F0390 Unspecified dementia without behavioral disturbance: Secondary | ICD-10-CM | POA: Diagnosis present

## 2019-07-23 DIAGNOSIS — J1282 Pneumonia due to coronavirus disease 2019: Secondary | ICD-10-CM | POA: Diagnosis present

## 2019-07-23 LAB — C-REACTIVE PROTEIN: CRP: 3.6 mg/dL — ABNORMAL HIGH (ref <1.0)

## 2019-07-23 MED ORDER — DEXAMETHASONE SODIUM PHOSPHATE 4 MG/ML IJ SOLN
4.0000 mg | Freq: Every day | INTRAMUSCULAR | Status: DC
  Administered 2019-07-23 – 2019-07-24 (×2): 4 mg via INTRAVENOUS
  Filled 2019-07-23 (×2): qty 1

## 2019-07-23 MED ORDER — MAGNESIUM SULFATE IN D5W 1-5 GM/100ML-% IV SOLN
1.0000 g | Freq: Once | INTRAVENOUS | Status: AC
  Administered 2019-07-23: 13:00:00 1 g via INTRAVENOUS
  Filled 2019-07-23: qty 100

## 2019-07-23 MED ORDER — GUAIFENESIN-DM 100-10 MG/5ML PO SYRP
5.0000 mL | ORAL_SOLUTION | ORAL | Status: DC | PRN
  Administered 2019-07-24 – 2019-07-25 (×2): 5 mL via ORAL
  Filled 2019-07-23 (×2): qty 10
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
## 2019-07-23 NOTE — ED Notes (Signed)
Craige Cotta, NP paged due to pt 86% on RA while sleeping. Pt placed on 1L [place], O2 saturations increased to 93%. Pt sleeping on left side, even and unlabored respirations.

## 2019-07-23 NOTE — ED Notes (Signed)
Report called and given to Gabriel Rung, RN

## 2019-07-23 NOTE — ED Notes (Signed)
PTAR at bedside 

## 2019-07-23 NOTE — Progress Notes (Signed)
Pt admitted from [facility] ED. Pt on RA. VSS. Tranferred from stretcher to chair with 1 person assist. No skin issues noted. IV to left wrist, SL. Connected to tele monitor and continuous pulse ox. Introduced to Haematologist and educated on equipment. Pt oriented but pleasantly confused at times. Will continue to monitor pt

## 2019-07-23 NOTE — ED Notes (Signed)
Attempted to call report, no answer at this time.

## 2019-07-23 NOTE — ED Notes (Signed)
Attempted to call report, no answer on the unit at this time.
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
## 2019-07-23 NOTE — ED Notes (Signed)
Attempted to call report, no answer at this time, will try back in 10 min.

## 2019-07-23 NOTE — ED Notes (Signed)
[facility] called for transport 

## 2019-07-24 LAB — CBC WITH DIFFERENTIAL/PLATELET
Abs Immature Granulocytes: 0.02 10*3/uL (ref 0.00–0.07)
Basophils Absolute: 0 10*3/uL (ref 0.0–0.1)
Basophils Relative: 0 %
Eosinophils Absolute: 0 10*3/uL (ref 0.0–0.5)
Eosinophils Relative: 0 %
HCT: 33.5 % — ABNORMAL LOW (ref 39.0–52.0)
Hemoglobin: 11.1 g/dL — ABNORMAL LOW (ref 13.0–17.0)
Immature Granulocytes: 0 %
Lymphocytes Relative: 22 %
Lymphs Abs: 1.2 10*3/uL (ref 0.7–4.0)
MCH: 31 pg (ref 26.0–34.0)
MCHC: 33.1 g/dL (ref 30.0–36.0)
MCV: 93.6 fL (ref 80.0–100.0)
Monocytes Absolute: 0.6 10*3/uL (ref 0.1–1.0)
Monocytes Relative: 11 %
Neutro Abs: 3.7 10*3/uL (ref 1.7–7.7)
Neutrophils Relative %: 67 %
Platelets: 222 10*3/uL (ref 150–400)
RBC: 3.58 MIL/uL — ABNORMAL LOW (ref 4.22–5.81)
RDW: 11.9 % (ref 11.5–15.5)
WBC: 5.5 10*3/uL (ref 4.0–10.5)
nRBC: 0 % (ref 0.0–0.2)

## 2019-07-24 LAB — COMPREHENSIVE METABOLIC PANEL WITH GFR
ALT: 18 U/L (ref 0–44)
AST: 28 U/L (ref 15–41)
Albumin: 2.9 g/dL — ABNORMAL LOW (ref 3.5–5.0)
Alkaline Phosphatase: 37 U/L — ABNORMAL LOW (ref 38–126)
Anion gap: 9 (ref 5–15)
BUN: 24 mg/dL — ABNORMAL HIGH (ref 8–23)
CO2: 24 mmol/L (ref 22–32)
Calcium: 7.9 mg/dL — ABNORMAL LOW (ref 8.9–10.3)
Chloride: 101 mmol/L (ref 98–111)
Creatinine, Ser: 0.78 mg/dL (ref 0.61–1.24)
GFR calc Af Amer: >60 mL/min
GFR calc non Af Amer: >60 mL/min
Glucose, Bld: 138 mg/dL — ABNORMAL HIGH (ref 70–99)
Potassium: 3.8 mmol/L (ref 3.5–5.1)
Sodium: 134 mmol/L — ABNORMAL LOW (ref 135–145)
Total Bilirubin: 0.7 mg/dL (ref 0.3–1.2)
Total Protein: 6.4 g/dL — ABNORMAL LOW (ref 6.5–8.1)

## 2019-07-24 LAB — BRAIN NATRIURETIC PEPTIDE: B Natriuretic Peptide: 240.9 pg/mL — ABNORMAL HIGH (ref 0.0–100.0)

## 2019-07-24 LAB — C-REACTIVE PROTEIN: CRP: 2 mg/dL — ABNORMAL HIGH

## 2019-07-24 LAB — D-DIMER, QUANTITATIVE: D-Dimer, Quant: 0.49 ug/mL-FEU (ref 0.00–0.50)

## 2019-07-24 LAB — MAGNESIUM: Magnesium: 2.3 mg/dL (ref 1.7–2.4)

## 2019-07-24 MED ORDER — DEXAMETHASONE SODIUM PHOSPHATE 4 MG/ML IJ SOLN
3.0000 mg | Freq: Every day | INTRAMUSCULAR | Status: DC
  Administered 2019-07-25 – 2019-07-26 (×2): 3 mg via INTRAVENOUS
  Filled 2019-07-24 (×2): qty 1

## 2019-07-24 NOTE — TOC Progression Note (Signed)
Transition of Care [facility]) - Progression Note    Patient Details  Name: [name] [name] MRN: [id number] Date of Birth: [birth year]  Transition of Care [facility]) CM/SW Contact  Elliot Gault, LCSW Phone Number: 07/24/2019, 12:28 PM  Clinical Narrative:     TOC following. Awaiting PT/OT evaluations. MD anticipating pt will need SNF. Spoke with pt's daughter, Lupita Leash, by phone to discuss. Lupita Leash agrees that pt will need SNF. She requests referrals to any facilities that will accept pt's with COVID diagnosis and she also states that she has reached out to pt's [facility] doctor to see if there is anything they can offer.  Referrals started. Will send therapy notes when available.  TOC will follow.   Expected Discharge Plan: Skilled Nursing Facility Barriers to Discharge: Continued Medical Work up, English as a second language teacher  Expected Discharge Plan and Services Expected Discharge Plan: Skilled Nursing Facility     Post Acute Care Choice: Skilled Nursing Facility Living arrangements for the past 2 months: Single Family Home                                       Social Determinants of Health (SDOH) Interventions    Readmission Risk Interventions No flowsheet data found.
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
## 2019-07-24 NOTE — Plan of Care (Signed)
Plan of Care reviewed. 
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
## 2019-07-25 LAB — CBC WITH DIFFERENTIAL/PLATELET
Abs Immature Granulocytes: 0.02 10*3/uL (ref 0.00–0.07)
Basophils Absolute: 0 10*3/uL (ref 0.0–0.1)
Basophils Relative: 0 %
Eosinophils Absolute: 0 10*3/uL (ref 0.0–0.5)
Eosinophils Relative: 0 %
HCT: 37.5 % — ABNORMAL LOW (ref 39.0–52.0)
Hemoglobin: 12.5 g/dL — ABNORMAL LOW (ref 13.0–17.0)
Immature Granulocytes: 0 %
Lymphocytes Relative: 24 %
Lymphs Abs: 1.4 10*3/uL (ref 0.7–4.0)
MCH: 31.2 pg (ref 26.0–34.0)
MCHC: 33.3 g/dL (ref 30.0–36.0)
MCV: 93.5 fL (ref 80.0–100.0)
Monocytes Absolute: 0.8 10*3/uL (ref 0.1–1.0)
Monocytes Relative: 14 %
Neutro Abs: 3.5 10*3/uL (ref 1.7–7.7)
Neutrophils Relative %: 62 %
Platelets: 270 10*3/uL (ref 150–400)
RBC: 4.01 MIL/uL — ABNORMAL LOW (ref 4.22–5.81)
RDW: 11.9 % (ref 11.5–15.5)
WBC: 5.6 10*3/uL (ref 4.0–10.5)
nRBC: 0 % (ref 0.0–0.2)

## 2019-07-25 LAB — COMPREHENSIVE METABOLIC PANEL
ALT: 21 U/L (ref 0–44)
AST: 32 U/L (ref 15–41)
Albumin: 3.2 g/dL — ABNORMAL LOW (ref 3.5–5.0)
Alkaline Phosphatase: 37 U/L — ABNORMAL LOW (ref 38–126)
Anion gap: 9 (ref 5–15)
BUN: 22 mg/dL (ref 8–23)
CO2: 27 mmol/L (ref 22–32)
Calcium: 8 mg/dL — ABNORMAL LOW (ref 8.9–10.3)
Chloride: 99 mmol/L (ref 98–111)
Creatinine, Ser: 0.81 mg/dL (ref 0.61–1.24)
GFR calc Af Amer: >60 mL/min (ref >60)
GFR calc non Af Amer: >60 mL/min (ref >60)
Glucose, Bld: 109 mg/dL — ABNORMAL HIGH (ref 70–99)
Potassium: 3.6 mmol/L (ref 3.5–5.1)
Sodium: 135 mmol/L (ref 135–145)
Total Bilirubin: 0.5 mg/dL (ref 0.3–1.2)
Total Protein: 6.8 g/dL (ref 6.5–8.1)

## 2019-07-25 LAB — BRAIN NATRIURETIC PEPTIDE: B Natriuretic Peptide: 285.9 pg/mL — ABNORMAL HIGH (ref 0.0–100.0)

## 2019-07-25 LAB — MAGNESIUM: Magnesium: 2.1 mg/dL (ref 1.7–2.4)

## 2019-07-25 LAB — C-REACTIVE PROTEIN: CRP: 1 mg/dL — ABNORMAL HIGH (ref <1.0)

## 2019-07-25 LAB — D-DIMER, QUANTITATIVE: D-Dimer, Quant: 0.4 ug/mL-FEU (ref 0.00–0.50)
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
## 2019-07-25 NOTE — Plan of Care (Signed)
Plan of Care reviewed. 

## 2019-07-26 LAB — CBC WITH DIFFERENTIAL/PLATELET
Abs Immature Granulocytes: 0.04 10*3/uL (ref 0.00–0.07)
Basophils Absolute: 0 10*3/uL (ref 0.0–0.1)
Basophils Relative: 0 %
Eosinophils Absolute: 0 10*3/uL (ref 0.0–0.5)
Eosinophils Relative: 0 %
HCT: 39.9 % (ref 39.0–52.0)
Hemoglobin: 13.4 g/dL (ref 13.0–17.0)
Immature Granulocytes: 0 %
Lymphocytes Relative: 19 %
Lymphs Abs: 1.8 10*3/uL (ref 0.7–4.0)
MCH: 31.2 pg (ref 26.0–34.0)
MCHC: 33.6 g/dL (ref 30.0–36.0)
MCV: 92.8 fL (ref 80.0–100.0)
Monocytes Absolute: 1.3 10*3/uL — ABNORMAL HIGH (ref 0.1–1.0)
Monocytes Relative: 14 %
Neutro Abs: 6.3 10*3/uL (ref 1.7–7.7)
Neutrophils Relative %: 67 %
Platelets: 327 10*3/uL (ref 150–400)
RBC: 4.3 MIL/uL (ref 4.22–5.81)
RDW: 11.9 % (ref 11.5–15.5)
WBC: 9.4 10*3/uL (ref 4.0–10.5)
nRBC: 0 % (ref 0.0–0.2)

## 2019-07-26 LAB — BRAIN NATRIURETIC PEPTIDE: B Natriuretic Peptide: 153.6 pg/mL — ABNORMAL HIGH (ref 0.0–100.0)

## 2019-07-26 LAB — COMPREHENSIVE METABOLIC PANEL
ALT: 24 U/L (ref 0–44)
AST: 33 U/L (ref 15–41)
Albumin: 3.3 g/dL — ABNORMAL LOW (ref 3.5–5.0)
Alkaline Phosphatase: 36 U/L — ABNORMAL LOW (ref 38–126)
Anion gap: 10 (ref 5–15)
BUN: 28 mg/dL — ABNORMAL HIGH (ref 8–23)
CO2: 26 mmol/L (ref 22–32)
Calcium: 8.1 mg/dL — ABNORMAL LOW (ref 8.9–10.3)
Chloride: 99 mmol/L (ref 98–111)
Creatinine, Ser: 0.88 mg/dL (ref 0.61–1.24)
GFR calc Af Amer: >60 mL/min (ref >60)
GFR calc non Af Amer: >60 mL/min (ref >60)
Glucose, Bld: 80 mg/dL (ref 70–99)
Potassium: 3.4 mmol/L — ABNORMAL LOW (ref 3.5–5.1)
Sodium: 135 mmol/L (ref 135–145)
Total Bilirubin: 1 mg/dL (ref 0.3–1.2)
Total Protein: 7 g/dL (ref 6.5–8.1)

## 2019-07-26 LAB — C-REACTIVE PROTEIN: CRP: 0.6 mg/dL (ref <1.0)

## 2019-07-26 LAB — D-DIMER, QUANTITATIVE: D-Dimer, Quant: 0.51 ug/mL-FEU — ABNORMAL HIGH (ref 0.00–0.50)

## 2019-07-26 LAB — MAGNESIUM: Magnesium: 2.1 mg/dL (ref 1.7–2.4)

## 2019-07-26 MED ORDER — POTASSIUM CHLORIDE CRYS ER 20 MEQ PO TBCR
40.0000 meq | EXTENDED_RELEASE_TABLET | Freq: Once | ORAL | Status: AC
  Administered 2019-07-26: 09:00:00 40 meq via ORAL
  Filled 2019-07-26: qty 2
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
## 2019-07-26 NOTE — Plan of Care (Signed)
Discharge

## 2019-07-26 NOTE — Discharge Instructions (Signed)
Follow with Primary MD Hayden Rasmussen, MD in 7 days   Get CBC, CMP, 2 view Chest X ray -  checked next visit within 1 week by Primary MD   Activity: As tolerated with Full fall precautions use walker/cane & assistance as needed  Disposition Home   Diet: Dysphagia 2 diet,  with feeding assistance and aspiration precautions.   Special Instructions: If you have smoked or chewed Tobacco  in the last 2 yrs please stop smoking, stop any regular Alcohol  and or any Recreational drug use.  On your next visit with your primary care physician please Get Medicines reviewed and adjusted.  Please request your Prim.MD to go over all Hospital Tests and Procedure/Radiological results at the follow up, please get all Hospital records sent to your Prim MD by signing hospital release before you go home.  If you experience worsening of your admission symptoms, develop shortness of breath, life threatening emergency, suicidal or homicidal thoughts you must seek medical attention immediately by calling 911 or calling your MD immediately  if symptoms less severe.  You Must read complete instructions/literature along with all the possible adverse reactions/side effects for all the Medicines you take and that have been prescribed to you. Take any new Medicines after you have completely understood and accpet all the possible adverse reactions/side effects.          Person Under Monitoring Name: [name] [name]  Location: [place] [address]   Infection Prevention Recommendations for Individuals Confirmed to have, or Being Evaluated for, 2019 Novel Coronavirus (COVID-19) Infection Who Receive Care at Home  Individuals who are confirmed to have, or are being evaluated for, COVID-19 should follow the prevention steps below until a healthcare provider or local or state health department says they can return to normal activities.  Stay home except to get medical care You should  restrict activities outside your home, except for getting medical care. Do not go to work, school, or public areas, and do not use public transportation or taxis.  Call ahead before visiting your doctor Before your medical appointment, call the healthcare provider and tell them that you have, or are being evaluated for, COVID-19 infection. This will help the healthcare providers office take steps to keep other people from getting infected. Ask your healthcare provider to call the local or state health department.  Monitor your symptoms Seek prompt medical attention if your illness is worsening (e.g., difficulty breathing). Before going to your medical appointment, call the healthcare provider and tell them that you have, or are being evaluated for, COVID-19 infection. Ask your healthcare provider to call the local or state health department.  Wear a facemask You should wear a facemask that covers your nose and mouth when you are in the same room with other people and when you visit a healthcare provider. People who live with or visit you should also wear a facemask while they are in the same room with you.  Separate yourself from other people in your home As much as possible, you should stay in a different room from other people in your home. Also, you should use a separate bathroom, if available.  Avoid sharing household items You should not share dishes, drinking glasses, cups, eating utensils, towels, bedding, or other items with other people in your home. After using these items, you should wash them thoroughly with soap and water.  Cover your coughs and sneezes Cover your mouth and nose with a tissue when you  cough or sneeze, or you can cough or sneeze into your sleeve. Throw used tissues in a lined trash can, and immediately wash your hands with soap and water for at least 20 seconds or use an alcohol-based hand rub.  Wash your [facility] your hands often and thoroughly with  soap and water for at least 20 seconds. You can use an alcohol-based hand sanitizer if soap and water are not available and if your hands are not visibly dirty. Avoid touching your eyes, nose, and mouth with unwashed hands.   Prevention Steps for Caregivers and Household Members of Individuals Confirmed to have, or Being Evaluated for, COVID-19 Infection Being Cared for in the Home  If you live with, or provide care at home for, a person confirmed to have, or being evaluated for, COVID-19 infection please follow these guidelines to prevent infection:  Follow healthcare providers instructions Make sure that you understand and can help the patient follow any healthcare provider instructions for all care.  Provide for the patients basic needs You should help the patient with basic needs in the home and provide support for getting groceries, prescriptions, and other personal needs.  Monitor the patients symptoms If they are getting sicker, call his or her medical provider and tell them that the patient has, or is being evaluated for, COVID-19 infection. This will help the healthcare providers office take steps to keep other people from getting infected. Ask the healthcare provider to call the local or state health department.  Limit the number of people who have contact with the patient  If possible, have only one caregiver for the patient.  Other household members should stay in another home or place of residence. If this is not possible, they should stay  in another room, or be separated from the patient as much as possible. Use a separate bathroom, if available.  Restrict visitors who do not have an essential need to be in the home.  Keep older adults, very young children, and other sick people away from the patient Keep older adults, very young children, and those who have compromised immune systems or chronic health conditions away from the patient. This includes people with  chronic heart, lung, or kidney conditions, diabetes, and cancer.  Ensure good ventilation Make sure that shared spaces in the home have good air flow, such as from an air conditioner or an opened window, weather permitting.  Wash your hands often  Wash your hands often and thoroughly with soap and water for at least 20 seconds. You can use an alcohol based hand sanitizer if soap and water are not available and if your hands are not visibly dirty.  Avoid touching your eyes, nose, and mouth with unwashed hands.  Use disposable paper towels to dry your hands. If not available, use dedicated cloth towels and replace them when they become wet.  Wear a facemask and gloves  Wear a disposable facemask at all times in the room and gloves when you touch or have contact with the patients blood, body fluids, and/or secretions or excretions, such as sweat, saliva, sputum, nasal mucus, vomit, urine, or feces.  Ensure the mask fits over your nose and mouth tightly, and do not touch it during use.  Throw out disposable facemasks and gloves after using them. Do not reuse.  Wash your hands immediately after removing your facemask and gloves.  If your personal clothing becomes contaminated, carefully remove clothing and launder. Wash your hands after handling contaminated clothing.  Place  all used disposable facemasks, gloves, and other waste in a lined container before disposing them with other household waste.  Remove gloves and wash your hands immediately after handling these items.  Do not share dishes, glasses, or other household items with the patient  Avoid sharing household items. You should not share dishes, drinking glasses, cups, eating utensils, towels, bedding, or other items with a patient who is confirmed to have, or being evaluated for, COVID-19 infection.  After the person uses these items, you should wash them thoroughly with soap and water.  Wash laundry thoroughly  Immediately  remove and wash clothes or bedding that have blood, body fluids, and/or secretions or excretions, such as sweat, saliva, sputum, nasal mucus, vomit, urine, or feces, on them.  Wear gloves when handling laundry from the patient.  Read and follow directions on labels of laundry or clothing items and detergent. In general, wash and dry with the warmest temperatures recommended on the label.  Clean all areas the individual has used often  Clean all touchable surfaces, such as counters, tabletops, doorknobs, bathroom fixtures, toilets, phones, keyboards, tablets, and bedside tables, every day. Also, clean any surfaces that may have blood, body fluids, and/or secretions or excretions on them.  Wear gloves when cleaning surfaces the patient has come in contact with.  Use a diluted bleach solution (e.g., dilute bleach with 1 part bleach and 10 parts water) or a household disinfectant with a label that says EPA-registered for coronaviruses. To make a bleach solution at home, add 1 tablespoon of bleach to 1 quart (4 cups) of water. For a larger supply, add  cup of bleach to 1 gallon (16 cups) of water.  Read labels of cleaning products and follow recommendations provided on product labels. Labels contain instructions for safe and effective use of the cleaning product including precautions you should take when applying the product, such as wearing gloves or eye protection and making sure you have good ventilation during use of the product.  Remove gloves and wash hands immediately after cleaning.  Monitor yourself for signs and symptoms of illness Caregivers and household members are considered close contacts, should monitor their health, and will be asked to limit movement outside of the home to the extent possible. Follow the monitoring steps for close contacts listed on the symptom monitoring form.   ? If you have additional questions, contact your local health department or call the epidemiologist  on call at [phone] (available 24/7). ? This guidance is subject to change. For the most up-to-date guidance from [facility], please refer to their website: [email or web address]

## 2019-09-05 ENCOUNTER — Ambulatory Visit: Payer: Medicare Other | Attending: Internal Medicine

## 2019-09-05 DIAGNOSIS — Z23 Encounter for immunization: Secondary | ICD-10-CM

## 2019-09-05 NOTE — Progress Notes (Signed)
   Covid-19 Vaccination Clinic  Name:  [name] [name]    MRN: [id number] DOB: [birth year]  09/05/2019  [name] was observed post Covid-19 immunization for 15 minutes without incidence. He was provided with Vaccine Information Sheet and instruction to access the V-Safe system.   [name] was instructed to call 911 with any severe reactions post vaccine: Marland Kitchen Difficulty breathing  . Swelling of your face and throat  . A fast heartbeat  . A bad rash all over your body  . Dizziness and weakness    Immunizations Administered    Name Date Dose VIS Date Route   Pfizer COVID-19 Vaccine 09/05/2019  3:47 PM 0.3 mL 06/26/2019 Intramuscular   Manufacturer: [employer], [employer]   Lot: [id number]   NDC: [address]-0

## 2019-09-28 ENCOUNTER — Ambulatory Visit: Payer: Medicare Other | Attending: Internal Medicine

## 2019-09-28 DIAGNOSIS — Z23 Encounter for immunization: Secondary | ICD-10-CM

## 2019-09-28 NOTE — Progress Notes (Signed)
   Covid-19 Vaccination Clinic  Name:  Ethanjames Fontenot    MRN: [id number] DOB: 08/21/1930  09/28/2019  Mr. Vipond was observed post Covid-19 immunization for 15 minutes without incident. He was provided with Vaccine Information Sheet and instruction to access the V-Safe system.   Mr. Heidinger was instructed to call 911 with any severe reactions post vaccine: Marland Kitchen Difficulty breathing  . Swelling of face and throat  . A fast heartbeat  . A bad rash all over body  . Dizziness and weakness   Immunizations Administered    Name Date Dose VIS Date Route   Pfizer COVID-19 Vaccine 09/28/2019 10:54 AM 0.3 mL 06/26/2019 Intramuscular   Manufacturer: [employer], [employer]   Lot: [id number]   NDC: [address]-6

## 2019-09-29 ENCOUNTER — Other Ambulatory Visit: Payer: Self-pay

## 2020-03-24 ENCOUNTER — Ambulatory Visit
Admission: EM | Admit: 2020-03-24 | Discharge: 2020-03-24 | Disposition: A | Discharge status: Home / Self Care | Payer: Medicare Other | Attending: Physician Assistant | Admitting: Physician Assistant

## 2020-03-24 ENCOUNTER — Other Ambulatory Visit: Payer: Self-pay

## 2020-03-24 DIAGNOSIS — R059 Cough, unspecified: Secondary | ICD-10-CM

## 2020-03-24 DIAGNOSIS — Z1152 Encounter for screening for COVID-19: Secondary | ICD-10-CM | POA: Diagnosis not present

## 2020-03-24 NOTE — ED Triage Notes (Signed)
Per daughter pt started coughing last night with raddled in his chest. Pt denies SOB. No distressed noted. Needs covid testing.
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
## 2020-03-26 LAB — SARS-COV-2, NAA 2 DAY TAT

## 2020-03-26 LAB — NOVEL CORONAVIRUS, NAA: SARS-CoV-2, NAA: NOT DETECTED

## 2020-05-14 ENCOUNTER — Other Ambulatory Visit: Payer: Self-pay

## 2020-05-14 ENCOUNTER — Emergency Department (HOSPITAL_COMMUNITY)
Admission: EM | Admit: 2020-05-14 | Discharge: 2020-05-14 | Disposition: A | Discharge status: Home / Self Care | Payer: Medicare Other | Attending: Emergency Medicine | Admitting: Emergency Medicine

## 2020-05-14 ENCOUNTER — Emergency Department (HOSPITAL_COMMUNITY): Payer: Medicare Other

## 2020-05-14 ENCOUNTER — Encounter (HOSPITAL_COMMUNITY): Payer: Self-pay | Admitting: *Deleted

## 2020-05-14 DIAGNOSIS — Z8616 Personal history of COVID-19: Secondary | ICD-10-CM | POA: Diagnosis not present

## 2020-05-14 DIAGNOSIS — S51012A Laceration without foreign body of left elbow, initial encounter: Secondary | ICD-10-CM | POA: Diagnosis present

## 2020-05-14 DIAGNOSIS — S51019A Laceration without foreign body of unspecified elbow, initial encounter: Secondary | ICD-10-CM

## 2020-05-14 DIAGNOSIS — Z87891 Personal history of nicotine dependence: Secondary | ICD-10-CM | POA: Diagnosis not present

## 2020-05-14 DIAGNOSIS — Z7982 Long term (current) use of aspirin: Secondary | ICD-10-CM | POA: Insufficient documentation

## 2020-05-14 DIAGNOSIS — F0390 Unspecified dementia without behavioral disturbance: Secondary | ICD-10-CM | POA: Insufficient documentation

## 2020-05-14 DIAGNOSIS — W19XXXA Unspecified fall, initial encounter: Secondary | ICD-10-CM | POA: Insufficient documentation
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
## 2020-05-14 NOTE — ED Provider Notes (Signed)
Medical screening examination/treatment/procedure(s) were conducted as a shared visit with non-physician practitioner(s) and myself.  I personally evaluated the patient during the encounter.    Patient fell.  Fall was unwitnessed.  He was found on the ground.  He is scraped both elbows.  Brought in by family for evaluation.  Patient has history of dementia.  Patient's mental status is at baseline.  Patient is alert.  He is confused.  He is picking at monitors and leads.  No respiratory distress.  Abrasions on elbows have been dressed.  He is using both upper extremities to manipulate items in his lap.  I agree with plan of management.   Arby Barrette, MD 05/15/20 1620

## 2020-05-14 NOTE — ED Triage Notes (Signed)
Unwitnessed fall, skin tears to elbows, pt received covid booster yesterday and today has not been his normal self.

## 2020-05-14 NOTE — Discharge Instructions (Signed)
Your x-ray showed a small joint effusions which can sometimes happen after a fall.  No evidence of fracture or dislocation.  If you are still having pain in 2 weeks, you will need a repeat x-ray to ensure that there is not a small fracture that was missed.  Your CT head was reassuring.  Your Steri-Strips to fall off in the next few days.  He can continue dressing the wounds and apply bacitracin or Neosporin after the Steri-Strips to fall.  Return the emergency department for any worsening pain, vomiting, difficulty moving arms or any other worsening concerning symptoms.

## 2020-05-14 NOTE — ED Notes (Signed)
Dressing applied to left elbow skin tear.

## 2020-12-27 ENCOUNTER — Emergency Department (HOSPITAL_COMMUNITY): Payer: Medicare Other

## 2020-12-27 ENCOUNTER — Emergency Department (HOSPITAL_COMMUNITY)
Admission: EM | Admit: 2020-12-27 | Discharge: 2020-12-28 | Disposition: A | Discharge status: Hospice | Payer: Medicare Other | Attending: Emergency Medicine | Admitting: Emergency Medicine

## 2020-12-27 DIAGNOSIS — R778 Other specified abnormalities of plasma proteins: Secondary | ICD-10-CM | POA: Insufficient documentation

## 2020-12-27 DIAGNOSIS — Z87891 Personal history of nicotine dependence: Secondary | ICD-10-CM | POA: Diagnosis not present

## 2020-12-27 DIAGNOSIS — Z8616 Personal history of COVID-19: Secondary | ICD-10-CM | POA: Diagnosis not present

## 2020-12-27 DIAGNOSIS — Z20822 Contact with and (suspected) exposure to covid-19: Secondary | ICD-10-CM | POA: Insufficient documentation

## 2020-12-27 DIAGNOSIS — R000 Tachycardia, unspecified: Secondary | ICD-10-CM | POA: Insufficient documentation

## 2020-12-27 DIAGNOSIS — Z7982 Long term (current) use of aspirin: Secondary | ICD-10-CM | POA: Diagnosis not present

## 2020-12-27 DIAGNOSIS — J181 Lobar pneumonia, unspecified organism: Secondary | ICD-10-CM | POA: Diagnosis not present

## 2020-12-27 DIAGNOSIS — I447 Left bundle-branch block, unspecified: Secondary | ICD-10-CM | POA: Diagnosis not present

## 2020-12-27 DIAGNOSIS — F0391 Unspecified dementia with behavioral disturbance: Secondary | ICD-10-CM | POA: Diagnosis not present

## 2020-12-27 DIAGNOSIS — R0602 Shortness of breath: Secondary | ICD-10-CM | POA: Diagnosis present

## 2020-12-27 DIAGNOSIS — J189 Pneumonia, unspecified organism: Secondary | ICD-10-CM

## 2020-12-27 DIAGNOSIS — I509 Heart failure, unspecified: Secondary | ICD-10-CM | POA: Diagnosis not present

## 2020-12-27 DIAGNOSIS — Z7189 Other specified counseling: Secondary | ICD-10-CM

## 2020-12-27 DIAGNOSIS — F0390 Unspecified dementia without behavioral disturbance: Secondary | ICD-10-CM | POA: Insufficient documentation

## 2020-12-27 DIAGNOSIS — Z515 Encounter for palliative care: Secondary | ICD-10-CM

## 2020-12-27 DIAGNOSIS — I214 Non-ST elevation (NSTEMI) myocardial infarction: Secondary | ICD-10-CM

## 2020-12-27 LAB — CBC WITH DIFFERENTIAL/PLATELET
Abs Immature Granulocytes: 0.05 10*3/uL (ref 0.00–0.07)
Basophils Absolute: 0 10*3/uL (ref 0.0–0.1)
Basophils Relative: 0 %
Eosinophils Absolute: 0 10*3/uL (ref 0.0–0.5)
Eosinophils Relative: 0 %
HCT: 35.7 % — ABNORMAL LOW (ref 39.0–52.0)
Hemoglobin: 11.4 g/dL — ABNORMAL LOW (ref 13.0–17.0)
Immature Granulocytes: 0 %
Lymphocytes Relative: 6 %
Lymphs Abs: 0.8 10*3/uL (ref 0.7–4.0)
MCH: 30.6 pg (ref 26.0–34.0)
MCHC: 31.9 g/dL (ref 30.0–36.0)
MCV: 95.7 fL (ref 80.0–100.0)
Monocytes Absolute: 1.2 10*3/uL — ABNORMAL HIGH (ref 0.1–1.0)
Monocytes Relative: 9 %
Neutro Abs: 11.6 10*3/uL — ABNORMAL HIGH (ref 1.7–7.7)
Neutrophils Relative %: 85 %
Platelets: 299 10*3/uL (ref 150–400)
RBC: 3.73 MIL/uL — ABNORMAL LOW (ref 4.22–5.81)
RDW: 11.9 % (ref 11.5–15.5)
WBC: 13.6 10*3/uL — ABNORMAL HIGH (ref 4.0–10.5)
nRBC: 0 % (ref 0.0–0.2)

## 2020-12-27 LAB — I-STAT CHEM 8, ED
BUN: 14 mg/dL (ref 8–23)
Calcium, Ion: 1.07 mmol/L — ABNORMAL LOW (ref 1.15–1.40)
Chloride: 99 mmol/L (ref 98–111)
Creatinine, Ser: 1 mg/dL (ref 0.61–1.24)
Glucose, Bld: 115 mg/dL — ABNORMAL HIGH (ref 70–99)
HCT: 34 % — ABNORMAL LOW (ref 39.0–52.0)
Hemoglobin: 11.6 g/dL — ABNORMAL LOW (ref 13.0–17.0)
Potassium: 4 mmol/L (ref 3.5–5.1)
Sodium: 134 mmol/L — ABNORMAL LOW (ref 135–145)
TCO2: 24 mmol/L (ref 22–32)

## 2020-12-27 LAB — COMPREHENSIVE METABOLIC PANEL
ALT: 11 U/L (ref 0–44)
AST: 23 U/L (ref 15–41)
Albumin: 3 g/dL — ABNORMAL LOW (ref 3.5–5.0)
Alkaline Phosphatase: 44 U/L (ref 38–126)
Anion gap: 7 (ref 5–15)
BUN: 12 mg/dL (ref 8–23)
CO2: 24 mmol/L (ref 22–32)
Calcium: 8.5 mg/dL — ABNORMAL LOW (ref 8.9–10.3)
Chloride: 101 mmol/L (ref 98–111)
Creatinine, Ser: 1.08 mg/dL (ref 0.61–1.24)
GFR, Estimated: >60 mL/min (ref >60)
Glucose, Bld: 111 mg/dL — ABNORMAL HIGH (ref 70–99)
Potassium: 4 mmol/L (ref 3.5–5.1)
Sodium: 132 mmol/L — ABNORMAL LOW (ref 135–145)
Total Bilirubin: 0.6 mg/dL (ref 0.3–1.2)
Total Protein: 6.7 g/dL (ref 6.5–8.1)

## 2020-12-27 LAB — TROPONIN I (HIGH SENSITIVITY)
Troponin I (High Sensitivity): 974 ng/L (ref <18)
Troponin I (High Sensitivity): 1603 ng/L (ref <18)

## 2020-12-27 LAB — RESP PANEL BY RT-PCR (FLU A&B, COVID) ARPGX2
Influenza A by PCR: NEGATIVE
Influenza B by PCR: NEGATIVE
SARS Coronavirus 2 by RT PCR: NEGATIVE

## 2020-12-27 LAB — BRAIN NATRIURETIC PEPTIDE: B Natriuretic Peptide: 1148.7 pg/mL — ABNORMAL HIGH (ref 0.0–100.0)

## 2020-12-27 MED ORDER — POLYVINYL ALCOHOL 1.4 % OP SOLN
1.0000 [drp] | Freq: Four times a day (QID) | OPHTHALMIC | Status: DC | PRN
  Filled 2020-12-27: qty 15

## 2020-12-27 MED ORDER — LORAZEPAM 1 MG PO TABS: 1.0000 mg | ORAL_TABLET | ORAL | Status: DC | PRN

## 2020-12-27 MED ORDER — MORPHINE SULFATE (PF) 2 MG/ML IV SOLN: 1.0000 mg | INTRAVENOUS | Status: DC | PRN

## 2020-12-27 MED ORDER — MORPHINE SULFATE (CONCENTRATE) 10 MG/0.5ML PO SOLN
5.0000 mg | ORAL | Status: DC
  Administered 2020-12-27: 5 mg via SUBLINGUAL
  Filled 2020-12-27 (×2): qty 0.5

## 2020-12-27 MED ORDER — SODIUM CHLORIDE 0.9 % IV BOLUS
500.0000 mL | Freq: Once | INTRAVENOUS | Status: AC
  Administered 2020-12-27: 09:00:00 500 mL via INTRAVENOUS

## 2020-12-27 MED ORDER — HALOPERIDOL LACTATE 2 MG/ML PO CONC
0.5000 mg | ORAL | Status: DC | PRN
  Filled 2020-12-27: qty 0.3

## 2020-12-27 MED ORDER — LORAZEPAM 2 MG/ML IJ SOLN: 1.0000 mg | INTRAMUSCULAR | Status: DC | PRN

## 2020-12-27 MED ORDER — HALOPERIDOL LACTATE 5 MG/ML IJ SOLN: 0.5000 mg | INTRAMUSCULAR | Status: DC | PRN

## 2020-12-27 MED ORDER — GLYCOPYRROLATE 1 MG PO TABS
1.0000 mg | ORAL_TABLET | ORAL | Status: DC | PRN
  Filled 2020-12-27: qty 1

## 2020-12-27 MED ORDER — SODIUM CHLORIDE 0.9 % IV SOLN
1.0000 g | Freq: Once | INTRAVENOUS | Status: AC
  Administered 2020-12-27: 11:00:00 1 g via INTRAVENOUS
  Filled 2020-12-27: qty 10

## 2020-12-27 MED ORDER — GLYCOPYRROLATE 0.2 MG/ML IJ SOLN: 0.2000 mg | INTRAMUSCULAR | Status: DC | PRN

## 2020-12-27 MED ORDER — ONDANSETRON HCL 4 MG/2ML IJ SOLN: 4.0000 mg | Freq: Four times a day (QID) | INTRAMUSCULAR | Status: DC | PRN

## 2020-12-27 MED ORDER — HALOPERIDOL 0.5 MG PO TABS
0.5000 mg | ORAL_TABLET | ORAL | Status: DC | PRN
  Filled 2020-12-27: qty 1

## 2020-12-27 MED ORDER — ASPIRIN 81 MG PO CHEW
324.0000 mg | CHEWABLE_TABLET | Freq: Once | ORAL | Status: AC
  Administered 2020-12-27: 12:00:00 324 mg via ORAL
  Filled 2020-12-27: qty 4

## 2020-12-27 MED ORDER — IOHEXOL 350 MG/ML SOLN
100.0000 mL | Freq: Once | INTRAVENOUS | Status: AC | PRN
  Administered 2020-12-27: 10:00:00 100 mL via INTRAVENOUS

## 2020-12-27 MED ORDER — ACETAMINOPHEN 325 MG PO TABS: 650.0000 mg | ORAL_TABLET | Freq: Four times a day (QID) | ORAL | Status: DC | PRN

## 2020-12-27 MED ORDER — LORAZEPAM 2 MG/ML PO CONC: 1.0000 mg | ORAL | Status: DC | PRN

## 2020-12-27 MED ORDER — ALBUTEROL SULFATE (2.5 MG/3ML) 0.083% IN NEBU
5.0000 mg | INHALATION_SOLUTION | Freq: Once | RESPIRATORY_TRACT | Status: AC
  Administered 2020-12-27: 12:00:00 5 mg via RESPIRATORY_TRACT
  Filled 2020-12-27: qty 6

## 2020-12-27 MED ORDER — BIOTENE DRY MOUTH MT LIQD: 15.0000 mL | OROMUCOSAL | Status: DC | PRN

## 2020-12-27 MED ORDER — ONDANSETRON 4 MG PO TBDP: 4.0000 mg | ORAL_TABLET | Freq: Four times a day (QID) | ORAL | Status: DC | PRN

## 2020-12-27 MED ORDER — SODIUM CHLORIDE 0.9 % IV SOLN
500.0000 mg | Freq: Once | INTRAVENOUS | Status: AC
  Administered 2020-12-27: 12:00:00 500 mg via INTRAVENOUS
  Filled 2020-12-27: qty 500

## 2020-12-27 MED ORDER — ACETAMINOPHEN 650 MG RE SUPP: 650.0000 mg | Freq: Four times a day (QID) | RECTAL | Status: DC | PRN
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
## 2020-12-27 NOTE — ED Triage Notes (Addendum)
Pt to ED via EMS c/o [facility] that started this morning. Hx: dementia. 87%on EMS arrival on room air, placed on CPAP, relief, then placed on NRB, Rales and wheezing noted. pt voicing no complaints. #20 L hand. No medications given by EMS. No respiratory history. Last VS: 113/70, hr 80 , rr 18, 98%NRB. CBG 161. Walks with cane, Per EMS COVID negative.

## 2020-12-27 NOTE — Progress Notes (Signed)
Patient ID: [name] [name], male   DOB: 05/23/1931, 87 y.o.   MRN: [id number]       Patient will be transferring to [facility].  Address:  [address], [place] [address]  Please call report to: [phone]     Sherlean Foot, NP-C Palliative Medicine   Please call Palliative Medicine team phone with any questions [phone]. For individual providers please see AMION.   No charge
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
## 2020-12-27 NOTE — ED Notes (Signed)
Called [facility], they have no time when pt will be picked up
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
## 2020-12-27 NOTE — ED Notes (Signed)
Pt daughter at bedside

## 2020-12-27 NOTE — ED Notes (Signed)
palliative care provider at bedside

## 2020-12-28 NOTE — ED Provider Notes (Signed)
Brief update note  87 year old male with dementia here for dyspnea found to have profoundly elevated troponin.  Cardiology and palliative care consulted.  After family discussions patient was enrolled in hospice services.  Consistent documentation throughout chart the patient is to be DNR.  Patient does not have durable DNR.  [facility] requesting DNR paperwork.  Discussed with patient's son and daughter.  Son is POA, Soil scientist.  They do not believe this form has been completed previously.  They both confirm the goal is patient's comfort and confirmed DNR/DNI.  Proceeded to complete DNR paperwork.  This is to be transported with patient to facility.  In addition to my conversations, RN Leavy Cella witnessed over phone to confirm with both son and daughter.  Patient to be discharged with [facility] to go to [facility].   Milagros Loll, MD 12/28/20 [phone]

## 2020-12-28 NOTE — ED Notes (Addendum)
Documentation given to [facility] med necessity,face sheet,avs

## 2020-12-30 DIAGNOSIS — R778 Other specified abnormalities of plasma proteins: Secondary | ICD-10-CM | POA: Insufficient documentation

## 2020-12-30 DIAGNOSIS — I509 Heart failure, unspecified: Secondary | ICD-10-CM | POA: Insufficient documentation

## 2020-12-30 DIAGNOSIS — R7989 Other specified abnormal findings of blood chemistry: Secondary | ICD-10-CM | POA: Insufficient documentation

## 2020-12-30 DIAGNOSIS — F03918 Unspecified dementia, unspecified severity, with other behavioral disturbance: Secondary | ICD-10-CM | POA: Insufficient documentation

## 2020-12-30 DIAGNOSIS — Z7189 Other specified counseling: Secondary | ICD-10-CM | POA: Insufficient documentation

## 2020-12-30 DIAGNOSIS — Z515 Encounter for palliative care: Secondary | ICD-10-CM | POA: Insufficient documentation

## 2021-01-13 DEATH — deceased
